# Patient Record
Sex: Male | Born: 1940 | Race: White | Hispanic: No | Marital: Single | Smoking: Former smoker
Health system: Southern US, Community
[De-identification: ages and names within clinical notes are randomized; demographics above are authoritative.]

## PROBLEM LIST (undated history)

## (undated) DIAGNOSIS — I509 Heart failure, unspecified: Secondary | ICD-10-CM

## (undated) DIAGNOSIS — E785 Hyperlipidemia, unspecified: Secondary | ICD-10-CM

## (undated) DIAGNOSIS — C801 Malignant (primary) neoplasm, unspecified: Secondary | ICD-10-CM

## (undated) DIAGNOSIS — I2510 Atherosclerotic heart disease of native coronary artery without angina pectoris: Secondary | ICD-10-CM

## (undated) DIAGNOSIS — J449 Chronic obstructive pulmonary disease, unspecified: Secondary | ICD-10-CM

## (undated) DIAGNOSIS — I10 Essential (primary) hypertension: Secondary | ICD-10-CM

## (undated) DIAGNOSIS — E119 Type 2 diabetes mellitus without complications: Secondary | ICD-10-CM

---

## 2016-12-15 ENCOUNTER — Emergency Department (HOSPITAL_COMMUNITY): Payer: Medicare Other

## 2016-12-15 ENCOUNTER — Encounter (HOSPITAL_COMMUNITY): Payer: Self-pay

## 2016-12-15 ENCOUNTER — Inpatient Hospital Stay (HOSPITAL_COMMUNITY)
Admission: EM | Admit: 2016-12-15 | Discharge: 2016-12-19 | DRG: 193 | Disposition: A | Discharge status: Home Health | Payer: Medicare Other | Attending: Internal Medicine | Admitting: Internal Medicine

## 2016-12-15 DIAGNOSIS — I25118 Atherosclerotic heart disease of native coronary artery with other forms of angina pectoris: Secondary | ICD-10-CM

## 2016-12-15 DIAGNOSIS — J181 Lobar pneumonia, unspecified organism: Principal | ICD-10-CM | POA: Diagnosis present

## 2016-12-15 DIAGNOSIS — J9621 Acute and chronic respiratory failure with hypoxia: Secondary | ICD-10-CM | POA: Diagnosis present

## 2016-12-15 DIAGNOSIS — I4891 Unspecified atrial fibrillation: Secondary | ICD-10-CM | POA: Diagnosis not present

## 2016-12-15 DIAGNOSIS — E785 Hyperlipidemia, unspecified: Secondary | ICD-10-CM | POA: Diagnosis present

## 2016-12-15 DIAGNOSIS — L97909 Non-pressure chronic ulcer of unspecified part of unspecified lower leg with unspecified severity: Secondary | ICD-10-CM

## 2016-12-15 DIAGNOSIS — Z794 Long term (current) use of insulin: Secondary | ICD-10-CM

## 2016-12-15 DIAGNOSIS — I5022 Chronic systolic (congestive) heart failure: Secondary | ICD-10-CM | POA: Diagnosis present

## 2016-12-15 DIAGNOSIS — I2510 Atherosclerotic heart disease of native coronary artery without angina pectoris: Secondary | ICD-10-CM | POA: Diagnosis present

## 2016-12-15 DIAGNOSIS — R791 Abnormal coagulation profile: Secondary | ICD-10-CM | POA: Diagnosis present

## 2016-12-15 DIAGNOSIS — N183 Chronic kidney disease, stage 3 (moderate): Secondary | ICD-10-CM | POA: Diagnosis present

## 2016-12-15 DIAGNOSIS — I493 Ventricular premature depolarization: Secondary | ICD-10-CM | POA: Diagnosis present

## 2016-12-15 DIAGNOSIS — K7031 Alcoholic cirrhosis of liver with ascites: Secondary | ICD-10-CM

## 2016-12-15 DIAGNOSIS — E1121 Type 2 diabetes mellitus with diabetic nephropathy: Secondary | ICD-10-CM | POA: Diagnosis present

## 2016-12-15 DIAGNOSIS — I130 Hypertensive heart and chronic kidney disease with heart failure and stage 1 through stage 4 chronic kidney disease, or unspecified chronic kidney disease: Secondary | ICD-10-CM | POA: Diagnosis present

## 2016-12-15 DIAGNOSIS — L97829 Non-pressure chronic ulcer of other part of left lower leg with unspecified severity: Secondary | ICD-10-CM

## 2016-12-15 DIAGNOSIS — Z23 Encounter for immunization: Secondary | ICD-10-CM

## 2016-12-15 DIAGNOSIS — E119 Type 2 diabetes mellitus without complications: Secondary | ICD-10-CM

## 2016-12-15 DIAGNOSIS — I2699 Other pulmonary embolism without acute cor pulmonale: Secondary | ICD-10-CM

## 2016-12-15 DIAGNOSIS — J189 Pneumonia, unspecified organism: Secondary | ICD-10-CM

## 2016-12-15 DIAGNOSIS — D696 Thrombocytopenia, unspecified: Secondary | ICD-10-CM | POA: Diagnosis present

## 2016-12-15 DIAGNOSIS — I10 Essential (primary) hypertension: Secondary | ICD-10-CM

## 2016-12-15 DIAGNOSIS — I509 Heart failure, unspecified: Secondary | ICD-10-CM

## 2016-12-15 DIAGNOSIS — N289 Disorder of kidney and ureter, unspecified: Secondary | ICD-10-CM

## 2016-12-15 DIAGNOSIS — J449 Chronic obstructive pulmonary disease, unspecified: Secondary | ICD-10-CM | POA: Diagnosis present

## 2016-12-15 DIAGNOSIS — E11622 Type 2 diabetes mellitus with other skin ulcer: Secondary | ICD-10-CM | POA: Diagnosis present

## 2016-12-15 DIAGNOSIS — K7460 Unspecified cirrhosis of liver: Secondary | ICD-10-CM | POA: Diagnosis present

## 2016-12-15 DIAGNOSIS — L97929 Non-pressure chronic ulcer of unspecified part of left lower leg with unspecified severity: Secondary | ICD-10-CM | POA: Diagnosis present

## 2016-12-15 DIAGNOSIS — I2609 Other pulmonary embolism with acute cor pulmonale: Secondary | ICD-10-CM | POA: Diagnosis not present

## 2016-12-15 DIAGNOSIS — Z7901 Long term (current) use of anticoagulants: Secondary | ICD-10-CM

## 2016-12-15 DIAGNOSIS — R188 Other ascites: Secondary | ICD-10-CM | POA: Diagnosis present

## 2016-12-15 DIAGNOSIS — E1122 Type 2 diabetes mellitus with diabetic chronic kidney disease: Secondary | ICD-10-CM | POA: Diagnosis present

## 2016-12-15 DIAGNOSIS — Z9981 Dependence on supplemental oxygen: Secondary | ICD-10-CM

## 2016-12-15 DIAGNOSIS — J440 Chronic obstructive pulmonary disease with acute lower respiratory infection: Secondary | ICD-10-CM | POA: Diagnosis present

## 2016-12-15 DIAGNOSIS — I83009 Varicose veins of unspecified lower extremity with ulcer of unspecified site: Secondary | ICD-10-CM | POA: Diagnosis present

## 2016-12-15 DIAGNOSIS — M542 Cervicalgia: Secondary | ICD-10-CM | POA: Diagnosis present

## 2016-12-15 DIAGNOSIS — I872 Venous insufficiency (chronic) (peripheral): Secondary | ICD-10-CM | POA: Diagnosis present

## 2016-12-15 HISTORY — DX: Type 2 diabetes mellitus without complications: E11.9

## 2016-12-15 HISTORY — DX: Atherosclerotic heart disease of native coronary artery without angina pectoris: I25.10

## 2016-12-15 HISTORY — DX: Chronic obstructive pulmonary disease, unspecified: J44.9

## 2016-12-15 HISTORY — DX: Essential (primary) hypertension: I10

## 2016-12-15 HISTORY — DX: Malignant (primary) neoplasm, unspecified: C80.1

## 2016-12-15 HISTORY — DX: Hyperlipidemia, unspecified: E78.5

## 2016-12-15 HISTORY — DX: Heart failure, unspecified: I50.9

## 2016-12-15 LAB — BASIC METABOLIC PANEL
Anion gap: 11 (ref 5–15)
BUN: 21 mg/dL — ABNORMAL HIGH (ref 6–20)
CHLORIDE: 101 mmol/L (ref 101–111)
CO2: 25 mmol/L (ref 22–32)
Calcium: 9.4 mg/dL (ref 8.9–10.3)
Creatinine, Ser: 1.27 mg/dL — ABNORMAL HIGH (ref 0.61–1.24)
GFR calc non Af Amer: 54 mL/min — ABNORMAL LOW (ref >60)
Glucose, Bld: 136 mg/dL — ABNORMAL HIGH (ref 65–99)
POTASSIUM: 4.1 mmol/L (ref 3.5–5.1)
SODIUM: 137 mmol/L (ref 135–145)

## 2016-12-15 LAB — I-STAT TROPONIN, ED: Troponin i, poc: 0.01 ng/mL (ref 0.00–0.08)

## 2016-12-15 LAB — CBC
HEMATOCRIT: 46.8 % (ref 39.0–52.0)
HEMOGLOBIN: 15.2 g/dL (ref 13.0–17.0)
MCH: 27.9 pg (ref 26.0–34.0)
MCHC: 32.5 g/dL (ref 30.0–36.0)
MCV: 85.9 fL (ref 78.0–100.0)
Platelets: 139 10*3/uL — ABNORMAL LOW (ref 150–400)
RBC: 5.45 MIL/uL (ref 4.22–5.81)
RDW: 15.1 % (ref 11.5–15.5)
WBC: 10 10*3/uL (ref 4.0–10.5)

## 2016-12-15 LAB — PROTIME-INR
INR: 4.54
Prothrombin Time: 44.6 seconds — ABNORMAL HIGH (ref 11.4–15.2)

## 2016-12-15 LAB — BRAIN NATRIURETIC PEPTIDE: B NATRIURETIC PEPTIDE 5: 453.6 pg/mL — AB (ref 0.0–100.0)

## 2016-12-15 MED ORDER — DEXTROSE 5 % IV SOLN
1.0000 g | Freq: Every day | INTRAVENOUS | Status: DC
  Administered 2016-12-16 – 2016-12-17 (×3): 1 g via INTRAVENOUS
  Filled 2016-12-15 (×4): qty 10

## 2016-12-15 MED ORDER — SODIUM CHLORIDE 0.9% FLUSH: 3.0000 mL | INTRAVENOUS | Status: DC | PRN

## 2016-12-15 MED ORDER — ISOSORBIDE MONONITRATE 20 MG PO TABS
20.0000 mg | ORAL_TABLET | Freq: Two times a day (BID) | ORAL | Status: DC
  Administered 2016-12-16 – 2016-12-19 (×7): 20 mg via ORAL
  Filled 2016-12-15 (×8): qty 1

## 2016-12-15 MED ORDER — FUROSEMIDE 20 MG PO TABS
160.0000 mg | ORAL_TABLET | Freq: Every day | ORAL | Status: DC
  Administered 2016-12-16 – 2016-12-19 (×4): 160 mg via ORAL
  Filled 2016-12-15 (×4): qty 8

## 2016-12-15 MED ORDER — SODIUM CHLORIDE 0.9 % IV BOLUS (SEPSIS)
1000.0000 mL | Freq: Once | INTRAVENOUS | Status: AC
  Administered 2016-12-15: 1000 mL via INTRAVENOUS

## 2016-12-15 MED ORDER — SODIUM CHLORIDE 0.9 % IV SOLN: 250.0000 mL | INTRAVENOUS | Status: DC | PRN

## 2016-12-15 MED ORDER — INSULIN ASPART 100 UNIT/ML ~~LOC~~ SOLN
0.0000 [IU] | Freq: Three times a day (TID) | SUBCUTANEOUS | Status: DC
Start: 2016-12-16 — End: 2016-12-19
  Administered 2016-12-16: 1 [IU] via SUBCUTANEOUS
  Administered 2016-12-16: 3 [IU] via SUBCUTANEOUS
  Administered 2016-12-17 (×2): 2 [IU] via SUBCUTANEOUS
  Administered 2016-12-18: 1 [IU] via SUBCUTANEOUS
  Administered 2016-12-18 – 2016-12-19 (×2): 2 [IU] via SUBCUTANEOUS

## 2016-12-15 MED ORDER — INSULIN GLARGINE 100 UNIT/ML ~~LOC~~ SOLN
18.0000 [IU] | Freq: Every day | SUBCUTANEOUS | Status: DC
  Administered 2016-12-16 (×2): 18 [IU] via SUBCUTANEOUS
  Filled 2016-12-15 (×2): qty 0.18

## 2016-12-15 MED ORDER — IPRATROPIUM-ALBUTEROL 0.5-2.5 (3) MG/3ML IN SOLN
3.0000 mL | RESPIRATORY_TRACT | Status: DC | PRN
  Administered 2016-12-16: 3 mL via RESPIRATORY_TRACT
  Filled 2016-12-15: qty 3

## 2016-12-15 MED ORDER — HYDROCODONE-ACETAMINOPHEN 5-325 MG PO TABS
1.0000 | ORAL_TABLET | ORAL | Status: DC | PRN
  Administered 2016-12-16: 1 via ORAL
  Administered 2016-12-17: 2 via ORAL
  Administered 2016-12-17: 1 via ORAL
  Administered 2016-12-18 (×2): 2 via ORAL
  Administered 2016-12-19: 1 via ORAL
  Filled 2016-12-15 (×2): qty 1
  Filled 2016-12-15: qty 2
  Filled 2016-12-15 (×4): qty 1
  Filled 2016-12-15: qty 2

## 2016-12-15 MED ORDER — MORPHINE SULFATE (PF) 4 MG/ML IV SOLN
4.0000 mg | Freq: Once | INTRAVENOUS | Status: AC
  Administered 2016-12-15: 4 mg via INTRAVENOUS
  Filled 2016-12-15: qty 1

## 2016-12-15 MED ORDER — INSULIN ASPART 100 UNIT/ML ~~LOC~~ SOLN
0.0000 [IU] | Freq: Every day | SUBCUTANEOUS | Status: DC
  Administered 2016-12-17: 2 [IU] via SUBCUTANEOUS

## 2016-12-15 MED ORDER — ACETAMINOPHEN 325 MG PO TABS: 650.0000 mg | ORAL_TABLET | Freq: Four times a day (QID) | ORAL | Status: DC | PRN

## 2016-12-15 MED ORDER — SODIUM CHLORIDE 0.9% FLUSH
3.0000 mL | Freq: Two times a day (BID) | INTRAVENOUS | Status: DC
  Administered 2016-12-16 – 2016-12-19 (×5): 3 mL via INTRAVENOUS

## 2016-12-15 MED ORDER — SIMVASTATIN 40 MG PO TABS
40.0000 mg | ORAL_TABLET | Freq: Every day | ORAL | Status: DC
  Administered 2016-12-16 – 2016-12-18 (×3): 40 mg via ORAL
  Filled 2016-12-15 (×3): qty 1

## 2016-12-15 MED ORDER — AZITHROMYCIN 250 MG PO TABS
500.0000 mg | ORAL_TABLET | Freq: Every day | ORAL | Status: DC
  Administered 2016-12-16: 500 mg via ORAL
  Filled 2016-12-15: qty 2

## 2016-12-15 MED ORDER — IOPAMIDOL (ISOVUE-370) INJECTION 76%
INTRAVENOUS | Status: AC
  Administered 2016-12-15: 80 mL
  Filled 2016-12-15: qty 100

## 2016-12-15 NOTE — ED Notes (Signed)
Patient transported to CT 

## 2016-12-15 NOTE — ED Triage Notes (Signed)
Pt reports shortness of breath X3 days. Pt reports mildly productive cough with white sputum. Pt reports home O2 usage at home.

## 2016-12-15 NOTE — ED Provider Notes (Signed)
Emergency Department Provider Note   I have reviewed the triage vital signs and the nursing notes.   HISTORY  Chief Complaint Shortness of Breath   HPI Edward Meyer is a 75 y.o. male with PMH significant for HTN, CAD,DM,CHF,COPD came to ED with C/O worsening shortness of breath for 3 days. Patient flew from [place] to [place] on December 16, then drove to [place] today from [place]. Patient states that on December 16 he developed nausea vomiting and diarrhea which lasted for one day. Later he developed shortness of breath, cough with clear mucus and mild nasal congestion. He do endorse a few chills but denies any fever. He also complained of pain in the back of his neck traveling to his shoulders and down in the spine. He described it as intermittent sharp shooting pain. He also complain of pain in his left leg. Denies any swelling.  Patient uses 2 L of oxygen at night.  He is on Coumadin.   Past Medical History:  Diagnosis Date  . Cancer ([place])   . CHF (congestive heart failure) ([place])   . COPD (chronic obstructive pulmonary disease) ([place])   . Coronary artery disease   . Diabetes mellitus without complication ([place])   . Hyperlipidemia   . Hypertension     There are no active problems to display for this patient.   History reviewed. No pertinent surgical history.    Allergies Patient has no known allergies.  History reviewed. No pertinent family history.  Social History Social History  Substance Use Topics  . Smoking status: Never Smoker  . Smokeless tobacco: Never Used  . Alcohol use No    Review of Systems Constitutional: No fever, chills Eyes: No visual changes. ENT: No sore throat. Cardiovascular: Denies chest pain. Respiratory:  shortness of breath. Gastrointestinal: No abdominal pain.  No nausea, no vomiting.  No diarrhea.  No constipation. Genitourinary: Negative for dysuria. Musculoskeletal:  back pain. Left leg pain. Skin: Negative  for rash. Neurological: Negative for headaches, focal weakness or numbness. 10-point ROS otherwise negative.  ____________________________________________   PHYSICAL EXAM:  VITAL SIGNS: ED Triage Vitals  Enc Vitals Group     BP 12/15/16 1812 118/59     Pulse Rate 12/15/16 1812 87     Resp 12/15/16 1812 18     Temp 12/15/16 1812 98.3 F (36.8 C)     Temp Source 12/15/16 1812 Oral     SpO2 12/15/16 1812 95 %     Weight --      Height --      Head Circumference --      Peak Flow --      Pain Score 12/15/16 1813 6     Pain Loc --      Pain Edu? --      Excl. in [place]? --    Constitutional: Alert and oriented. Mildly tachypneic, in no acute distress. Eyes: Conjunctivae are normal. PERRL. EOMI. Head: Atraumatic. Mouth/Throat: Mucous membranes are moist.  Oropharynx non-erythematous. Neck: No stridor.  No meningeal signs. No cervical spine tenderness to palpation. Cardiovascular: Normal rate, regular rhythm. Good peripheral circulation. Grossly normal heart sounds.   Respiratory: There are mildly decreased air entry at right mid and lower lung field. No rhonchi or wheezing. Gastrointestinal: Soft and nontender. No distention.  Musculoskeletal: Left calf tenderness without any edema , there were 3 venous stasis ulcers on lateral side of left leg approximately 1 x1 cm, 1.52 cm and 2 3 cm with marked erythema and  serosanguineous discharge. Neurologic:  Normal speech and language. No gross focal neurologic deficits are appreciated.  Skin:  Skin is warm, dry and intact. No rash noted. Psychiatric: Mood and affect are normal. Speech and behavior are normal.  ____________________________________________   LABS (all labs ordered are listed, but only abnormal results are displayed)  Labs Reviewed  BASIC METABOLIC PANEL - Abnormal; Notable for the following:       Result Value   Glucose, Bld 136 (*)    BUN 21 (*)    Creatinine, Ser 1.27 (*)    GFR calc non Af Amer 54 (*)    All  other components within normal limits  CBC - Abnormal; Notable for the following:    Platelets 139 (*)    All other components within normal limits  BRAIN NATRIURETIC PEPTIDE - Abnormal; Notable for the following:    B Natriuretic Peptide 453.6 (*)    All other components within normal limits  PROTIME-INR - Abnormal; Notable for the following:    Prothrombin Time 44.6 (*)    INR 4.54 (*)    All other components within normal limits  I-STAT TROPOININ, ED   ____________________________________________  EKG   EKG Interpretation  Date/Time:  Thursday December 15 2016 18:19:51 EST Ventricular Rate:  84 PR Interval:  158 QRS Duration: 72 QT Interval:  454 QTC Calculation: 536 R Axis:   68 Text Interpretation:  Sinus rhythm with frequent Premature ventricular complexes Low voltage QRS Cannot rule out Anterior infarct , age undetermined Abnormal ECG Confirmed by DELO  MD, DOUGLAS ([id number]) on 12/15/2016 8:16:29 PM      ____________________________________________  RADIOLOGY  Dg Chest 2 View  Result Date: 12/15/2016 CLINICAL DATA:  Productive cough with shortness of breath for 3 days. Pain in the neck, both shoulders and upper back. History of asthma, pacemaker and diabetes. EXAM: CHEST  2 VIEW COMPARISON:  None. FINDINGS: Left subclavian AICD leads are present within the right atrium and right ventricle. The heart size is normal status post median sternotomy. There is aortic atherosclerosis. The lungs appear clear. There is no pleural effusion or pneumothorax. EKG snap overlies the upper right chest. Mild thoracic spine degenerative changes are noted. IMPRESSION: No acute cardiopulmonary process or explanation for the patient's symptoms identified. Electronically Signed   By: Richardean Sale M.D.   On: 12/15/2016 18:45   Ct Angio Chest Pe W Or Wo Contrast  Result Date: 12/15/2016 CLINICAL DATA:  75 year old male with shortness of breath for several years. History of COPD, coronary  artery disease, and CHF. EXAM: CT ANGIOGRAPHY CHEST WITH CONTRAST TECHNIQUE: Multidetector CT imaging of the chest was performed using the standard protocol during bolus administration of intravenous contrast. Multiplanar CT image reconstructions and MIPs were obtained to evaluate the vascular anatomy. CONTRAST:  80 cc Isovue 370 COMPARISON:  Chest radiograph dated 12/15/2016 FINDINGS: Cardiovascular: There is moderate cardiomegaly with dilatation of the right atrium. There is retrograde flow of contrast from the right atrium into the IVC compatible with a degree of right cardiac dysfunction. Correlation with echocardiogram recommended. There is advanced coronary vascular calcification involving the left main, LAD, left circumflex artery, and to lesser degree RCA. There is no pericardial effusion. Left pectoral dual lead AICD device with leads in the right atrium and right ventricle. There is moderate atherosclerotic calcification of the thoracic aorta. There is no aneurysmal dilatation. Evaluation of the pulmonary arteries is limited due to suboptimal opacification of the peripheral branches. There is however decreased opacification of the  segmental branch point and subsegmental branches of the right lower lobe pulmonary artery (series 5 image 164- 195) most suspicious for pulmonary artery embolus. Mediastinum/Nodes: There is no hilar or mediastinal adenopathy. The esophagus is grossly unremarkable. No mediastinal fluid collection. No thyroid nodules identified. Lungs/Pleura: Mild centrilobular emphysema. There is diffuse chronic interstitial coarsening and hazy ground-glass density throughout the lungs. No interstitial edema. There is a focal area of consolidation in the left infrahilar region within the lingula concerning for pneumonia. A focal pulmonary infarct is less likely. There is a small right pleural effusion with associated mild compressive atelectasis of the right lower lobe. Pneumonia is less likely.  There is no pneumothorax. The central airways are patent. Upper Abdomen: There is irregularity of the hepatic contour compatible with cirrhosis. Small perihepatic free fluid noted. The visualized upper abdomen is otherwise unremarkable. Musculoskeletal: Degenerative changes of the spine with multilevel osteophyte. No acute fracture. Median sternotomy wires noted and appear intact. Review of the MIP images confirms the above findings. IMPRESSION: Non opacification of the segmental branch point and subsegmental branches of the right lower lobe. Although there is suboptimal opacification of the distal pulmonary artery branches, findings are suspicious for PE. Focal left infrahilar/lingular consolidative change concerning for pneumonia. The pulmonary infarct is less likely. Clinical correlation is recommended. Moderate cardiomegaly with multivessel coronary vascular disease and evidence of right cardiac dysfunction. Correlation with echocardiogram recommended. Small right pleural effusion. Cirrhosis with partially visualized small perihepatic ascites. These results were called by telephone at the time of interpretation on 12/15/2016 at 10:13 pm to Dr. Veryl Speak , who verbally acknowledged these results. Electronically Signed   By: Anner Crete M.D.   On: 12/15/2016 22:21   Ct Cervical Spine Wo Contrast  Result Date: 12/15/2016 CLINICAL DATA:  75 year old male with neck pain. EXAM: CT CERVICAL SPINE WITHOUT CONTRAST TECHNIQUE: Multidetector CT imaging of the cervical spine was performed without intravenous contrast. Multiplanar CT image reconstructions were also generated. COMPARISON:  None. FINDINGS: Alignment: Normal. Skull base and vertebrae: No acute fracture. No primary bone lesion or focal pathologic process. Soft tissues and spinal canal: No prevertebral fluid or swelling. No visible canal hematoma. Disc levels: There is degenerative changes. Disc osteophyte complex with moderate narrowing of the left  neural foramina at C5-C6. The remainder of the neural foramina as well as the central canal appear widely patent. Upper chest: Negative. Cardiac pacemaker wires partially visualized in the left chest wall. Other: None IMPRESSION: No acute/ traumatic cervical spine pathology. Degenerative changes with disc osteophyte complex and moderate narrowing of the left neural foramina at C5-C6. Electronically Signed   By: Anner Crete M.D.   On: 12/15/2016 21:58    ____________________________________________   PROCEDURES  Procedure(s) performed:   Procedures   ____________________________________________   INITIAL IMPRESSION / ASSESSMENT AND PLAN / ED COURSE  Pertinent labs & imaging results that were available during my care of the patient were reviewed by me and considered in my medical decision making (see chart for details).  Edward Meyer is a 75 y.o. male with PMH significant for HTN, CAD,DM,CHF,COPD came to ED with C/O worsening shortness of breath for 3 days. Patient flew from [place] to [place] on December 16, then drove to [place] today from [place]. Was also complaining of he is sharp shooting neck pain, had more like cervical radiculopathy. No tingling or numbness or focal weakness.  He was saturating in mid 90s on room air. With mildly decreased air entry on right mid and lower  lung field.  He is also having venous stasis 3 ulcers on his left leg, 1 with active  serosanguineous discharge and erythema.  He is on Coumadin with supratherapeutic INR of 4.54.  On CTA he is having small left-sided some segmental PE and pleural effusion, there was also concern for left infrahilar/lingual consolidation suspected for pneumonia.  He also has an history of lymphoma, in remission since 2000.  [facility] hospitalist was called for admission. ____________________________________________  FINAL CLINICAL IMPRESSION(S) / ED DIAGNOSES  Pulmonary embolism.   MEDICATIONS GIVEN  DURING THIS VISIT:  Medications  sodium chloride 0.9 % bolus 1,000 mL (1,000 mLs Intravenous New Bag/Given 12/15/16 2052)  morphine 4 MG/ML injection 4 mg (4 mg Intravenous Given 12/15/16 2052)  iopamidol (ISOVUE-370) 76 % injection (80 mLs  Contrast Given 12/15/16 2118)     NEW OUTPATIENT MEDICATIONS STARTED DURING THIS VISIT:  New Prescriptions   No medications on file      Note:  This document was prepared using Dragon voice recognition software and may include unintentional dictation errors.   Emergency Medicine   Lorella Nimrod, MD 12/15/16 [place], MD 12/15/16 [phone]

## 2016-12-15 NOTE — H&P (Signed)
History and Physical    Edward Meyer [id number] DOB: 1941-02-04 DOA: 12/15/2016  PCP: Pcp Not In System   Patient coming from: Home  Chief Complaint: Dyspnea, productive cough  HPI: Edward Meyer is a 75 y.o. male with medical history significant for coronary artery disease, chronic CHF, COPD, insulin-dependent diabetes mellitus, hypertension, and cirrhosis who presents to the emergency department with 3 days of dyspnea, productive cough, and upper back pain. Patient was traveling from [place] 3 days prior to admission and developed acute GI upset with nausea, vomiting, and diarrhea while on the layover in [place]. Patient reports that the GI symptoms had resolved by the following day, but he had developed increase in his chronic dyspnea and a new productive cough. He denies any subjective fevers or chills associated with this and denies any chest pain or palpitations. He describes his upper back pain as sharp, severe, and worse with deep inspiration or cough. He reports taking warfarin for a "weak heart." He endorses chronic leg swelling and left leg pain associated with ulcers, but has not noted an increase in this. He denies personal or family history of VTE. Patient reports using 2 L/m supplemental oxygen at night, but left his concentrator back home in [place]. He denies any melena, hematochezia, or easy bruising or bleeding.    ED Course: Upon arrival to the ED, patient is found to be afebrile, saturating adequately on room air, and with vital signs stable. EKG features a sinus rhythm with PVCs and chest x-ray is negative for acute cardiopulmonary disease. Chemistry panel is notable for serum creatinine of 1.27 with no prior available for comparison. CBC features a mild thrombocytopenia with platelets 139,000. BNP is elevated to 454, troponin is within the normal limits, and INR is supratherapeutic at 4.54. CT of the cervical spine was obtained and demonstrates chronic degenerative  changes, but no acute abnormality. CTA PE study was obtained and findings are consistent with a likely PE in the right lower lobe segmental and subsegmental branches, as well as focal infrahilar and lingular consolidation concerning for pneumonia. Patient was treated with 1 L of normal saline and 4 mg IV morphine in the emergency department. He remained hemodynamically stable in the ED and has been in mild respiratory distress. He will be observed on the telemetry unit for ongoing evaluation and management of dyspnea and pleuritic pain with suspected PE despite supratherapeutic warfarin, as well as possible community-acquired pneumonia.  Review of Systems:  All other systems reviewed and apart from HPI, are negative.  Past Medical History:  Diagnosis Date  . Cancer ([place])   . CHF (congestive heart failure) ([place])   . COPD (chronic obstructive pulmonary disease) ([place])   . Coronary artery disease   . Diabetes mellitus without complication ([place])   . Hyperlipidemia   . Hypertension     History reviewed. No pertinent surgical history.   reports that he has never smoked. He has never used smokeless tobacco. He reports that he does not drink alcohol. His drug history is not on file.  No Known Allergies  History reviewed. No pertinent family history.   Prior to Admission medications   Medication Sig Start Date End Date Taking? Authorizing Provider  Ca Carbonate-Mag Hydroxide (ROLAIDS) 550-110 MG CHEW Chew 1 each by mouth daily as needed (heartburn).   Yes Historical Provider, MD  carvedilol (COREG) 6.25 MG tablet Take 6.25 mg by mouth 2 (two) times daily with a meal.   Yes Historical Provider, MD  furosemide (LASIX)  40 MG tablet Take 160 mg by mouth daily.   Yes Historical Provider, MD  insulin glargine (LANTUS) 100 UNIT/ML injection Inject 22 Units into the skin at bedtime.   Yes Historical Provider, MD  isosorbide mononitrate (ISMO,MONOKET) 20 MG tablet Take 20 mg by mouth 2 (two) times daily  at 10 AM and 5 PM.   Yes Historical Provider, MD  lisinopril (PRINIVIL,ZESTRIL) 5 MG tablet Take 5 mg by mouth daily.   Yes Historical Provider, MD  OXYGEN Inhale 2 L into the lungs at bedtime as needed (for SOB).    Yes Historical Provider, MD  potassium chloride SA (K-DUR,KLOR-CON) 20 MEQ tablet Take 20 mEq by mouth daily.   Yes Historical Provider, MD  simvastatin (ZOCOR) 40 MG tablet Take 40 mg by mouth daily at 6 PM.   Yes Historical Provider, MD  sotalol (BETAPACE) 80 MG tablet Take 80 mg by mouth 2 (two) times daily.   Yes Historical Provider, MD  warfarin (COUMADIN) 5 MG tablet Take 5-7.5 mg by mouth See admin instructions. Takes 5mg  on odd days  Takes 7.5mg  on even days   Yes Historical Provider, MD    Physical Exam: Vitals:   12/15/16 1921 12/15/16 1936 12/15/16 2000 12/15/16 2030  BP: (!) 108/52 119/62 110/58 128/68  Pulse: 82 83 81 (!) 28  Resp: 15 24 (!) 27 24  Temp:      TempSrc:      SpO2: 95% 96% 93% 91%      Constitutional: Appears chronically-ill, uncomfortable, no acute distress Eyes: PERTLA, lids and conjunctivae normal ENMT: Mucous membranes are moist. Posterior pharynx clear of any exudate or lesions.   Neck: normal, supple, no masses, no thyromegaly Respiratory: Slightly diminished bilaterally without appreciable wheeze, rhonchi, or crackles. No accessory muscle use.  Cardiovascular: S1 & S2 heard, regular rate and rhythm, grade 3 systolic murmur at lower sternal borders. 2+ edema to bilateral LEs. No significant JVD. Abdomen: Mild distension, mild tenderness throughout, no masses palpated. Bowel sounds normal.  Musculoskeletal: no clubbing / cyanosis. No joint deformity upper and lower extremities. Normal muscle tone.  Skin: Ulcers overly anterior lower left leg with crust, surrounding erythema, and slight warmth; no drainage or odor. Warm, dry, well-perfused. Neurologic: CN 2-12 grossly intact. Sensation intact, DTR normal. Strength 5/5 in all 4 limbs.    Psychiatric: Normal judgment and insight. Alert and oriented x 3. Normal mood and affect.     Labs on Admission: I have personally reviewed following labs and imaging studies  CBC:  Recent Labs Lab 12/15/16 1816  WBC 10.0  HGB 15.2  HCT 46.8  MCV 85.9  PLT [id number]*   Basic Metabolic Panel:  Recent Labs Lab 12/15/16 1816  NA 137  K 4.1  CL 101  CO2 25  GLUCOSE 136*  BUN 21*  CREATININE 1.27*  CALCIUM 9.4   GFR: CrCl cannot be calculated (Unknown ideal weight.). Liver Function Tests: No results for input(s): AST, ALT, ALKPHOS, BILITOT, PROT, ALBUMIN in the last 168 hours. No results for input(s): LIPASE, AMYLASE in the last 168 hours. No results for input(s): AMMONIA in the last 168 hours. Coagulation Profile:  Recent Labs Lab 12/15/16 2031  INR 4.54*   Cardiac Enzymes: No results for input(s): CKTOTAL, CKMB, CKMBINDEX, TROPONINI in the last 168 hours. BNP (last 3 results) No results for input(s): PROBNP in the last 8760 hours. HbA1C: No results for input(s): HGBA1C in the last 72 hours. CBG: No results for input(s): GLUCAP in the last 168  hours. Lipid Profile: No results for input(s): CHOL, HDL, LDLCALC, TRIG, CHOLHDL, LDLDIRECT in the last 72 hours. Thyroid Function Tests: No results for input(s): TSH, T4TOTAL, FREET4, T3FREE, THYROIDAB in the last 72 hours. Anemia Panel: No results for input(s): VITAMINB12, FOLATE, FERRITIN, TIBC, IRON, RETICCTPCT in the last 72 hours. Urine analysis: No results found for: COLORURINE, APPEARANCEUR, LABSPEC, PHURINE, GLUCOSEU, HGBUR, BILIRUBINUR, KETONESUR, PROTEINUR, UROBILINOGEN, NITRITE, LEUKOCYTESUR Sepsis Labs: @LABRCNTIP (procalcitonin:4,lacticidven:4) )No results found for this or any previous visit (from the past 240 hour(s)).   Radiological Exams on Admission: Dg Chest 2 View  Result Date: 12/15/2016 CLINICAL DATA:  Productive cough with shortness of breath for 3 days. Pain in the neck, both shoulders and  upper back. History of asthma, pacemaker and diabetes. EXAM: CHEST  2 VIEW COMPARISON:  None. FINDINGS: Left subclavian AICD leads are present within the right atrium and right ventricle. The heart size is normal status post median sternotomy. There is aortic atherosclerosis. The lungs appear clear. There is no pleural effusion or pneumothorax. EKG snap overlies the upper right chest. Mild thoracic spine degenerative changes are noted. IMPRESSION: No acute cardiopulmonary process or explanation for the patient's symptoms identified. Electronically Signed   By: Richardean Sale M.D.   On: 12/15/2016 18:45   Ct Angio Chest Pe W Or Wo Contrast  Result Date: 12/15/2016 CLINICAL DATA:  75 year old male with shortness of breath for several years. History of COPD, coronary artery disease, and CHF. EXAM: CT ANGIOGRAPHY CHEST WITH CONTRAST TECHNIQUE: Multidetector CT imaging of the chest was performed using the standard protocol during bolus administration of intravenous contrast. Multiplanar CT image reconstructions and MIPs were obtained to evaluate the vascular anatomy. CONTRAST:  80 cc Isovue 370 COMPARISON:  Chest radiograph dated 12/15/2016 FINDINGS: Cardiovascular: There is moderate cardiomegaly with dilatation of the right atrium. There is retrograde flow of contrast from the right atrium into the IVC compatible with a degree of right cardiac dysfunction. Correlation with echocardiogram recommended. There is advanced coronary vascular calcification involving the left main, LAD, left circumflex artery, and to lesser degree RCA. There is no pericardial effusion. Left pectoral dual lead AICD device with leads in the right atrium and right ventricle. There is moderate atherosclerotic calcification of the thoracic aorta. There is no aneurysmal dilatation. Evaluation of the pulmonary arteries is limited due to suboptimal opacification of the peripheral branches. There is however decreased opacification of the segmental  branch point and subsegmental branches of the right lower lobe pulmonary artery (series 5 image 164- 195) most suspicious for pulmonary artery embolus. Mediastinum/Nodes: There is no hilar or mediastinal adenopathy. The esophagus is grossly unremarkable. No mediastinal fluid collection. No thyroid nodules identified. Lungs/Pleura: Mild centrilobular emphysema. There is diffuse chronic interstitial coarsening and hazy ground-glass density throughout the lungs. No interstitial edema. There is a focal area of consolidation in the left infrahilar region within the lingula concerning for pneumonia. A focal pulmonary infarct is less likely. There is a small right pleural effusion with associated mild compressive atelectasis of the right lower lobe. Pneumonia is less likely. There is no pneumothorax. The central airways are patent. Upper Abdomen: There is irregularity of the hepatic contour compatible with cirrhosis. Small perihepatic free fluid noted. The visualized upper abdomen is otherwise unremarkable. Musculoskeletal: Degenerative changes of the spine with multilevel osteophyte. No acute fracture. Median sternotomy wires noted and appear intact. Review of the MIP images confirms the above findings. IMPRESSION: Non opacification of the segmental branch point and subsegmental branches of the right lower lobe.  Although there is suboptimal opacification of the distal pulmonary artery branches, findings are suspicious for PE. Focal left infrahilar/lingular consolidative change concerning for pneumonia. The pulmonary infarct is less likely. Clinical correlation is recommended. Moderate cardiomegaly with multivessel coronary vascular disease and evidence of right cardiac dysfunction. Correlation with echocardiogram recommended. Small right pleural effusion. Cirrhosis with partially visualized small perihepatic ascites. These results were called by telephone at the time of interpretation on 12/15/2016 at 10:13 pm to Dr.  Veryl Speak , who verbally acknowledged these results. Electronically Signed   By: Anner Crete M.D.   On: 12/15/2016 22:21   Ct Cervical Spine Wo Contrast  Result Date: 12/15/2016 CLINICAL DATA:  75 year old male with neck pain. EXAM: CT CERVICAL SPINE WITHOUT CONTRAST TECHNIQUE: Multidetector CT imaging of the cervical spine was performed without intravenous contrast. Multiplanar CT image reconstructions were also generated. COMPARISON:  None. FINDINGS: Alignment: Normal. Skull base and vertebrae: No acute fracture. No primary bone lesion or focal pathologic process. Soft tissues and spinal canal: No prevertebral fluid or swelling. No visible canal hematoma. Disc levels: There is degenerative changes. Disc osteophyte complex with moderate narrowing of the left neural foramina at C5-C6. The remainder of the neural foramina as well as the central canal appear widely patent. Upper chest: Negative. Cardiac pacemaker wires partially visualized in the left chest wall. Other: None IMPRESSION: No acute/ traumatic cervical spine pathology. Degenerative changes with disc osteophyte complex and moderate narrowing of the left neural foramina at C5-C6. Electronically Signed   By: Anner Crete M.D.   On: 12/15/2016 21:58    EKG: Independently reviewed. Sinus rhythm, PVCs  Assessment/Plan  1. Pulmonary embolism   - Pt presents with 3 days of SOB after travel with prolonged immobilization - CTA suggests likely PE in segmental and subsegmental branches of RLL; there is evidence for right-heart failure on the CT, but not clear if acute   - Pt is anticoagulated with warfarin and INR is 4.54 on admission - He is hemodynamically stable and not in acute respiratory distress on admission - Plan to obtain TTE for further evaluation of right-heart dysfunction, BLE dopplers for DVT (given PE despite supratherapeutic INR, IVC filter may be indicated if there is DVT)  - Monitor on telemetry with supportive care,  consider switching his anticoagulant    2. CAP - Focal infiltrate in lingula on CTA suggestive of PNA - There is no fever or leukocytosis, and respiratory sxs could be explained by the PE, but given the degree of patient's comorbidity, prudent to treat now - Check sputum culture and gram stain, check urine for strep pneumo antigens, start Rocephin and azithromycin    3. COPD  - No wheezing on exam  - Continue neb treatments    4. Cirrhosis  - Liver appears cirrhotic on CTA and there is mild ascites  - He endorses remote hx of alcohol abuse, denies hx of viral hepatitis  - He is managed at home with a non-selective beta-blocker, currently held out of concern for acute PE and right heart failure  5. Chronic CHF  - Pt appears roughly euvolemic on admission  - He was given a 1 liter NS bolus in ED  - He is managed at home with Lasix 120 mg qD, lisinopril, and Coreg - TTE is ordered as above   - Monitor SLIV, follow daily wts and I/Os, and fluid-restrict diet  - Planning to resume home-dose Lasix in am; hold lisinopril in setting of kidney disease of uncertain chronicity;  hold Coreg given PE with concern for right heart failure   6. CAD - Pt denies chest pain on admission - Troponin is negative and there are no acute ischemic features appreciated on EKG  - Continue Zocor and isosorbide; hold Coreg and lisinopril for now as above   7. Insulin-dependent DM  - No A1c on file  - Managed at home with Lantus 22 units qHS  - Check CBG with meals and qHS  - Start Lantus 18 units qHS with a low-intensity SSI correctional   8. Hypertension  - BP soft but stable in ED  - Managed at home with lisinopril, sotalol, and Coreg; these have been held as above - Monitor and treat with prn's for now    9. Kidney disease  - SCr is 1.27 on admission; pt denies hx of kidney disease  - He was given a liter of NS in ED and his lisinopril has been held  - Repeat chem panel in am   10. Venous stasis  with ulcers - There is evidence for chronic venous stasis and chronic-appearing ulcers noted on left shin  - There is some surrounding erythema and minimal warmth; no tenderness, drainage, or odor - Wound care consultation requested for recommendations     DVT prophylaxis: warfarin  Code Status: Full  Family Communication: Daughter updated at bedside at patient's request Disposition Plan: Observe on telemetry Consults called: None Admission status: Observation   Vianne Bulls, MD [facility] Pager [phone]  If 7PM-7AM, please contact night-coverage [email or web address] Password [facility]  12/15/2016, 11:24 PM

## 2016-12-16 ENCOUNTER — Encounter (HOSPITAL_COMMUNITY): Payer: Self-pay | Admitting: Family Medicine

## 2016-12-16 ENCOUNTER — Observation Stay (HOSPITAL_BASED_OUTPATIENT_CLINIC_OR_DEPARTMENT_OTHER): Payer: Medicare Other

## 2016-12-16 ENCOUNTER — Observation Stay (HOSPITAL_COMMUNITY): Payer: Medicare Other

## 2016-12-16 DIAGNOSIS — E1121 Type 2 diabetes mellitus with diabetic nephropathy: Secondary | ICD-10-CM | POA: Diagnosis present

## 2016-12-16 DIAGNOSIS — I2699 Other pulmonary embolism without acute cor pulmonale: Secondary | ICD-10-CM

## 2016-12-16 DIAGNOSIS — I872 Venous insufficiency (chronic) (peripheral): Secondary | ICD-10-CM | POA: Diagnosis present

## 2016-12-16 DIAGNOSIS — R188 Other ascites: Secondary | ICD-10-CM | POA: Diagnosis present

## 2016-12-16 DIAGNOSIS — I2510 Atherosclerotic heart disease of native coronary artery without angina pectoris: Secondary | ICD-10-CM | POA: Diagnosis present

## 2016-12-16 DIAGNOSIS — J9621 Acute and chronic respiratory failure with hypoxia: Secondary | ICD-10-CM | POA: Diagnosis present

## 2016-12-16 DIAGNOSIS — I2609 Other pulmonary embolism with acute cor pulmonale: Secondary | ICD-10-CM | POA: Diagnosis not present

## 2016-12-16 DIAGNOSIS — Z7901 Long term (current) use of anticoagulants: Secondary | ICD-10-CM | POA: Diagnosis not present

## 2016-12-16 DIAGNOSIS — Z9981 Dependence on supplemental oxygen: Secondary | ICD-10-CM | POA: Diagnosis not present

## 2016-12-16 DIAGNOSIS — E785 Hyperlipidemia, unspecified: Secondary | ICD-10-CM | POA: Diagnosis present

## 2016-12-16 DIAGNOSIS — J181 Lobar pneumonia, unspecified organism: Secondary | ICD-10-CM | POA: Diagnosis present

## 2016-12-16 DIAGNOSIS — J42 Unspecified chronic bronchitis: Secondary | ICD-10-CM

## 2016-12-16 DIAGNOSIS — I4891 Unspecified atrial fibrillation: Secondary | ICD-10-CM | POA: Diagnosis not present

## 2016-12-16 DIAGNOSIS — E11622 Type 2 diabetes mellitus with other skin ulcer: Secondary | ICD-10-CM | POA: Diagnosis present

## 2016-12-16 DIAGNOSIS — I130 Hypertensive heart and chronic kidney disease with heart failure and stage 1 through stage 4 chronic kidney disease, or unspecified chronic kidney disease: Secondary | ICD-10-CM | POA: Diagnosis present

## 2016-12-16 DIAGNOSIS — D696 Thrombocytopenia, unspecified: Secondary | ICD-10-CM | POA: Diagnosis present

## 2016-12-16 DIAGNOSIS — I493 Ventricular premature depolarization: Secondary | ICD-10-CM | POA: Diagnosis present

## 2016-12-16 DIAGNOSIS — Z23 Encounter for immunization: Secondary | ICD-10-CM | POA: Diagnosis present

## 2016-12-16 DIAGNOSIS — E1122 Type 2 diabetes mellitus with diabetic chronic kidney disease: Secondary | ICD-10-CM | POA: Diagnosis present

## 2016-12-16 DIAGNOSIS — E119 Type 2 diabetes mellitus without complications: Secondary | ICD-10-CM | POA: Diagnosis not present

## 2016-12-16 DIAGNOSIS — N183 Chronic kidney disease, stage 3 (moderate): Secondary | ICD-10-CM | POA: Diagnosis present

## 2016-12-16 DIAGNOSIS — I5022 Chronic systolic (congestive) heart failure: Secondary | ICD-10-CM | POA: Diagnosis present

## 2016-12-16 DIAGNOSIS — J440 Chronic obstructive pulmonary disease with acute lower respiratory infection: Secondary | ICD-10-CM | POA: Diagnosis present

## 2016-12-16 DIAGNOSIS — I5042 Chronic combined systolic (congestive) and diastolic (congestive) heart failure: Secondary | ICD-10-CM | POA: Diagnosis not present

## 2016-12-16 DIAGNOSIS — K7460 Unspecified cirrhosis of liver: Secondary | ICD-10-CM | POA: Diagnosis present

## 2016-12-16 DIAGNOSIS — L97929 Non-pressure chronic ulcer of unspecified part of left lower leg with unspecified severity: Secondary | ICD-10-CM | POA: Diagnosis present

## 2016-12-16 DIAGNOSIS — R791 Abnormal coagulation profile: Secondary | ICD-10-CM | POA: Diagnosis present

## 2016-12-16 DIAGNOSIS — Z794 Long term (current) use of insulin: Secondary | ICD-10-CM | POA: Diagnosis not present

## 2016-12-16 LAB — HIV ANTIBODY (ROUTINE TESTING W REFLEX): HIV Screen 4th Generation wRfx: NONREACTIVE

## 2016-12-16 LAB — GLUCOSE, CAPILLARY
GLUCOSE-CAPILLARY: 122 mg/dL — AB (ref 65–99)
GLUCOSE-CAPILLARY: 109 mg/dL — AB (ref 65–99)
GLUCOSE-CAPILLARY: 238 mg/dL — AB (ref 65–99)
Glucose-Capillary: 91 mg/dL (ref 65–99)
Glucose-Capillary: 105 mg/dL — ABNORMAL HIGH (ref 65–99)

## 2016-12-16 LAB — BASIC METABOLIC PANEL
Anion gap: 8 (ref 5–15)
BUN: 23 mg/dL — AB (ref 6–20)
CHLORIDE: 103 mmol/L (ref 101–111)
CO2: 26 mmol/L (ref 22–32)
CREATININE: 1.28 mg/dL — AB (ref 0.61–1.24)
Calcium: 9.1 mg/dL (ref 8.9–10.3)
GFR calc Af Amer: >60 mL/min (ref >60)
GFR calc non Af Amer: 54 mL/min — ABNORMAL LOW (ref >60)
GLUCOSE: 114 mg/dL — AB (ref 65–99)
POTASSIUM: 4.5 mmol/L (ref 3.5–5.1)
SODIUM: 137 mmol/L (ref 135–145)

## 2016-12-16 LAB — CBC WITH DIFFERENTIAL/PLATELET
Basophils Absolute: 0.1 10*3/uL (ref 0.0–0.1)
Basophils Relative: 1 %
EOS ABS: 0.2 10*3/uL (ref 0.0–0.7)
EOS PCT: 1 %
HCT: 43.2 % (ref 39.0–52.0)
Hemoglobin: 14 g/dL (ref 13.0–17.0)
LYMPHS ABS: 2.6 10*3/uL (ref 0.7–4.0)
LYMPHS PCT: 20 %
MCH: 27.8 pg (ref 26.0–34.0)
MCHC: 32.4 g/dL (ref 30.0–36.0)
MCV: 85.7 fL (ref 78.0–100.0)
Monocytes Absolute: 1.8 10*3/uL — ABNORMAL HIGH (ref 0.1–1.0)
Monocytes Relative: 13 %
Neutro Abs: 8.4 10*3/uL — ABNORMAL HIGH (ref 1.7–7.7)
Neutrophils Relative %: 65 %
PLATELETS: 149 10*3/uL — AB (ref 150–400)
RBC: 5.04 MIL/uL (ref 4.22–5.81)
RDW: 15.4 % (ref 11.5–15.5)
WBC: 13 10*3/uL — ABNORMAL HIGH (ref 4.0–10.5)

## 2016-12-16 LAB — PROTIME-INR
INR: 5.02
PROTHROMBIN TIME: 48 s — AB (ref 11.4–15.2)

## 2016-12-16 LAB — ECHOCARDIOGRAM COMPLETE
Height: 69 in
WEIGHTICAEL: 3022.4 [oz_av]

## 2016-12-16 LAB — PROCALCITONIN: Procalcitonin: 0.11 ng/mL

## 2016-12-16 MED ORDER — PNEUMOCOCCAL VAC POLYVALENT 25 MCG/0.5ML IJ INJ
0.5000 mL | INJECTION | INTRAMUSCULAR | Status: AC
  Administered 2016-12-17: 0.5 mL via INTRAMUSCULAR
  Filled 2016-12-16: qty 0.5

## 2016-12-16 MED ORDER — PERFLUTREN LIPID MICROSPHERE
1.0000 mL | INTRAVENOUS | Status: AC | PRN
  Administered 2016-12-16: 2 mL via INTRAVENOUS
  Filled 2016-12-16: qty 10

## 2016-12-16 MED ORDER — WARFARIN - PHARMACIST DOSING INPATIENT: Freq: Every day | Status: DC

## 2016-12-16 MED ORDER — COLLAGENASE 250 UNIT/GM EX OINT
TOPICAL_OINTMENT | Freq: Every day | CUTANEOUS | Status: DC
  Administered 2016-12-16: 21:00:00 via TOPICAL
  Administered 2016-12-17: 1 via TOPICAL
  Administered 2016-12-18 – 2016-12-19 (×2): via TOPICAL
  Filled 2016-12-16: qty 30

## 2016-12-16 NOTE — ED Notes (Signed)
MD made aware of critical labs

## 2016-12-16 NOTE — Progress Notes (Signed)
Walked into pt's room and found the pt's IV hub had been disconnected and was bleeding at the site. Upon further inspection we found the pt's bottom bed sheet soaked in blood and had also gone onto the floor forming an approximate 64ft x 6ft pool of blood. Pressure was immediately applied to site and the bleeding stopped. Vitals were obtained, pt was stable and asymptomatic.  Morning labs obtained Hgb now 14. Bed alarm on, call light within reach, will continue to monitor.

## 2016-12-16 NOTE — Progress Notes (Signed)
ANTICOAGULATION CONSULT NOTE - Initial Consult  Pharmacy Consult for Warfarin  Indication: Unclear, pt states "weak heart", pt from out of town  No Known Allergies  Patient Measurements: Height: 5\' 9"  (175.3 cm) Weight: 188 lb 14.4 oz (85.7 kg) IBW/kg (Calculated) : 70.7 Vital Signs: Temp: 97.7 F (36.5 C) (12/22 0045) Temp Source: Oral (12/22 0045) BP: 112/72 (12/22 0045) Pulse Rate: 79 (12/22 0045)  Labs:  Recent Labs  12/15/16 1816 12/15/16 2031  HGB 15.2  --   HCT 46.8  --   PLT 139*  --   LABPROT  --  44.6*  INR  --  4.54*  CREATININE 1.27*  --     Estimated Creatinine Clearance: 56.2 mL/min (by C-G formula based on SCr of 1.27 mg/dL (H)).   Medical History: Past Medical History:  Diagnosis Date  . Cancer ([place])   . CHF (congestive heart failure) ([place])   . COPD (chronic obstructive pulmonary disease) ([place])   . Coronary artery disease   . Diabetes mellitus without complication ([place])   . Hyperlipidemia   . Hypertension     Assessment: 75 y/o M from [place] here with dyspnea and cough, on warfarin PTA for ?, pt tells MD that it is for "weak heart". INR is supra-therapeutic on admit at 4.54. However, CT Angio was obtained and the following was found: Evaluation of the pulmonary arteries is limited due to suboptimal opacification of the peripheral branches.There is however decreased opacification of the segmental branch point and subsegmental branches of the right lower lobe pulmonary artery (series 5 image 164- 195) most suspicious for pulmonary artery embolus.   Goal of Therapy:  INR 2-3 Monitor platelets by anticoagulation protocol: Yes   Plan (as discussed with Dr. Myna Hidalgo): -No warfarin with elevated INR -Daily PT/INR -Given this is not a definitive new PE, wait until AM for further evaluation with dopplers to make sure pt doesn't have any new DVT. If DVT present, would definitely need alternative anti-coagulation  -Possible hematology consult per  MD -F/U indication for PTA warfarin   Narda Bonds 12/16/2016,1:28 AM

## 2016-12-16 NOTE — Progress Notes (Signed)
PROGRESS NOTE                                                                                                                                                                                                             Patient Demographics:    Edward Meyer, is a 75 y.o. male, DOB - 1941/09/25, PH:[id number]  Admit date - 12/15/2016   Admitting Physician Vianne Bulls, MD  Outpatient Primary MD for the patient is Pcp Not In System  LOS - 0  Outpatient Specialists: Follows in [facility]  Chief Complaint  Patient presents with  . Shortness of Breath       Brief Narrative   75 year old male with history of coronary artery disease, chronic CHF, COPD on home O2 at night, insulin-dependent diabetes mellitus, hypertension and cirrhosis of liver presented to the ED with 3 day history of dyspnea with productive cough and upper back/left shoulder pain. He is visiting his daughter for the holidays from [place]. He arrived here 3 days prior to admission. During the overlying [place] he developed acute GI upset with nausea, vomiting and diarrhea, which have now resolved. However he developed increased shortness of breath and productive cough. Denies any fevers or chills, chest pain or palpitations. He developed sharp pain in his upper back and posterior shoulder worsened with deep inspiration and coughing. He denied any abdominal pain, bowel or urinary symptoms. Patient is on chronic warfarin for unclear reason. Patient informs having chronic leg swellings and pain in the left leg associated with ulcers. Patient informs being ambulatory to a very short distance and gets tired and short of breath easily.  In the ED Patient was afebrile, maintaining O2 sat on room air with stable other vitals. EKG showed PVCs. Chest x-ray unremarkable. Labs showed creatinine 1.27., Mild thrombocytopenia and elevated BNP of 454. Troponin was normal.  INR supratherapeutic at 4.54. CT of the cervical spine showed chronic degenerative changes without acute findings. CT angiogram of the chest was suggestive of possible pulmonary embolism in right lower lobe segmental and subsegmental branches as well as focal infrahilar and lingular consolidation concerning for pneumonia. Patient received 1 L normal saline bolus and 4 mg IV morphine and was observed on telemetry.      Subjective:   Patient seen and examined this morning. Informs his breathing and cough to be  much better.   Assessment  & Plan :    Principal Problem:   ?Acute pulmonary embolism ([place]) As shown on CT angiogram on admission. Doppler of the bilateral lower extremities was negative for DVT. 2-D echo showed reduced EF but no right heart strain. -Patient has no further dyspnea or cough. Never had chest pain symptoms. His INR remains supratherapeutic (>5 today). -I discussed CT findings with the radiologist Dr. Leroy Sea again today. Upon discussing the clinical picture and supratherapeutic INR on Coumadin he suggested that this could well be an artifact on the right lung secondary to slow pulmonary flow due to low EF. -Since his respiratory symptoms are stable with negative lower extremity Doppler and no right heart strain I doubt patient has acute PE and unlikely failure on Coumadin with supratherapeutic INR. -If patient has worsened symptoms will need to consult PCCM.  Left lobar pneumonia ([place]) On empiric Rocephin. I have discontinued azithromycin given supratherapeutic INR. Supportive care with Tylenol and antitussives. Possibly discharge on Augmentin. Strep pneumonia antigen pending. Blood culture not sent on admission.   Chronic systolic CHF -Patient is from [place] so no results available to compare. 2-D echo showed EF of 20-25% with diffuse hypokinesis, moderate MR suppressed RV systolic function and severely increased PA pressure of 68 mmHg. -Appears euvolemic.  Continue daily Lasix. Monitor I/O. Continue Imdur and statin. -Resume Coreg and lisinopril.  Coronary artery disease Continue Imdur and Zocor. Resume coreg  Insulin-dependent diabetes mellitus Resume home dose Lantus. Monitor CBG with slight scale coverage.  Essential hypertension Soft blood pressure on admission. Now stable. Resume all home medications  ?chronic kidney disease No baseline available.   Venous Stasis with ulcers Chronic. Appreciate wound care evaluation. Added enzymatic debridement ointment.  Supratherapeutic INR On chronic warfarin. Dosing per pharmacy.   Code Status : No code  Family Communication  : Discussed with daughter on the phone  Disposition Plan  : Home tomorrow if breathing continues to improve  Barriers For Discharge : Improving symptoms  Consults  :  None  Procedures  :  CT angiogram of the chest CT of the cervical spine 2-D echo and Doppler lower extremity  DVT Prophylaxis  :  warfarin  Lab Results  Component Value Date   PLT 149 (L) 12/16/2016    Antibiotics  :    Anti-infectives    Start     Dose/Rate Route Frequency Ordered Stop   12/15/16 2330  cefTRIAXone (ROCEPHIN) 1 g in dextrose 5 % 50 mL IVPB     1 g 100 mL/hr over 30 Minutes Intravenous Daily at bedtime 12/15/16 2323 12/22/16 2159   12/15/16 2330  azithromycin (ZITHROMAX) tablet 500 mg  Status:  Discontinued     500 mg Oral Daily at bedtime 12/15/16 2323 12/16/16 0756        Objective:   Vitals:   12/15/16 2345 12/16/16 0029 12/16/16 0045 12/16/16 0450  BP: 108/66  112/72 (!) 102/55  Pulse: 80  79 91  Resp: 12  18 17   Temp:   97.7 F (36.5 C) 98.1 F (36.7 C)  TempSrc:   Oral Oral  SpO2: 95%  98% 97%  Weight:  85.7 kg (188 lb 14.4 oz)    Height:  5\' 9"  (1.753 m)      Wt Readings from Last 3 Encounters:  12/16/16 85.7 kg (188 lb 14.4 oz)     Intake/Output Summary (Last 24 hours) at 12/16/16 1451 Last data filed at 12/16/16 0846  Gross per 24  hour    Intake             1240 ml  Output              600 ml  Net              640 ml     Physical Exam  Gen: Elderly male not in distress HEENT:  moist mucosa, supple neck Chest: clear b/l, no added sounds CVS: Normal S1 and S2, no murmurs or gallop GI: soft, NT, ND,  Musculoskeletal: warm, trace edema bilaterally, chronic venous stasis ulcer CNS: Alert and oriented    Data Review:    CBC  Recent Labs Lab 12/15/16 1816 12/16/16 0523  WBC 10.0 13.0*  HGB 15.2 14.0  HCT 46.8 43.2  PLT 139* 149*  MCV 85.9 85.7  MCH 27.9 27.8  MCHC 32.5 32.4  RDW 15.1 15.4  LYMPHSABS  --  2.6  MONOABS  --  1.8*  EOSABS  --  0.2  BASOSABS  --  0.1    Chemistries   Recent Labs Lab 12/15/16 1816 12/16/16 0523  NA 137 137  K 4.1 4.5  CL 101 103  CO2 25 26  GLUCOSE 136* 114*  BUN 21* 23*  CREATININE 1.27* 1.28*  CALCIUM 9.4 9.1   ------------------------------------------------------------------------------------------------------------------ No results for input(s): CHOL, HDL, LDLCALC, TRIG, CHOLHDL, LDLDIRECT in the last 72 hours.  No results found for: HGBA1C ------------------------------------------------------------------------------------------------------------------ No results for input(s): TSH, T4TOTAL, T3FREE, THYROIDAB in the last 72 hours.  Invalid input(s): FREET3 ------------------------------------------------------------------------------------------------------------------ No results for input(s): VITAMINB12, FOLATE, FERRITIN, TIBC, IRON, RETICCTPCT in the last 72 hours.  Coagulation profile  Recent Labs Lab 12/15/16 2031 12/16/16 0523  INR 4.54* 5.02*    No results for input(s): DDIMER in the last 72 hours.  Cardiac Enzymes No results for input(s): CKMB, TROPONINI, MYOGLOBIN in the last 168 hours.  Invalid input(s): CK ------------------------------------------------------------------------------------------------------------------    Component  Value Date/Time   BNP 453.6 (H) 12/15/2016 1816    Inpatient Medications  Scheduled Meds: . cefTRIAXone (ROCEPHIN)  IV  1 g Intravenous QHS  . collagenase   Topical Daily  . furosemide  160 mg Oral Daily  . insulin aspart  0-5 Units Subcutaneous QHS  . insulin aspart  0-9 Units Subcutaneous TID WC  . insulin glargine  18 Units Subcutaneous QHS  . isosorbide mononitrate  20 mg Oral BID  . [START ON 12/17/2016] pneumococcal 23 valent vaccine  0.5 mL Intramuscular Tomorrow-1000  . simvastatin  40 mg Oral q1800  . sodium chloride flush  3 mL Intravenous Q12H   Continuous Infusions: PRN Meds:.sodium chloride, acetaminophen, HYDROcodone-acetaminophen, ipratropium-albuterol, sodium chloride flush  Micro Results No results found for this or any previous visit (from the past 240 hour(s)).  Radiology Reports Dg Chest 2 View  Result Date: 12/15/2016 CLINICAL DATA:  Productive cough with shortness of breath for 3 days. Pain in the neck, both shoulders and upper back. History of asthma, pacemaker and diabetes. EXAM: CHEST  2 VIEW COMPARISON:  None. FINDINGS: Left subclavian AICD leads are present within the right atrium and right ventricle. The heart size is normal status post median sternotomy. There is aortic atherosclerosis. The lungs appear clear. There is no pleural effusion or pneumothorax. EKG snap overlies the upper right chest. Mild thoracic spine degenerative changes are noted. IMPRESSION: No acute cardiopulmonary process or explanation for the patient's symptoms identified. Electronically Signed   By: Richardean Sale M.D.   On: 12/15/2016 18:45  Ct Angio Chest Pe W Or Wo Contrast  Result Date: 12/15/2016 CLINICAL DATA:  75 year old male with shortness of breath for several years. History of COPD, coronary artery disease, and CHF. EXAM: CT ANGIOGRAPHY CHEST WITH CONTRAST TECHNIQUE: Multidetector CT imaging of the chest was performed using the standard protocol during bolus  administration of intravenous contrast. Multiplanar CT image reconstructions and MIPs were obtained to evaluate the vascular anatomy. CONTRAST:  80 cc Isovue 370 COMPARISON:  Chest radiograph dated 12/15/2016 FINDINGS: Cardiovascular: There is moderate cardiomegaly with dilatation of the right atrium. There is retrograde flow of contrast from the right atrium into the IVC compatible with a degree of right cardiac dysfunction. Correlation with echocardiogram recommended. There is advanced coronary vascular calcification involving the left main, LAD, left circumflex artery, and to lesser degree RCA. There is no pericardial effusion. Left pectoral dual lead AICD device with leads in the right atrium and right ventricle. There is moderate atherosclerotic calcification of the thoracic aorta. There is no aneurysmal dilatation. Evaluation of the pulmonary arteries is limited due to suboptimal opacification of the peripheral branches. There is however decreased opacification of the segmental branch point and subsegmental branches of the right lower lobe pulmonary artery (series 5 image 164- 195) most suspicious for pulmonary artery embolus. Mediastinum/Nodes: There is no hilar or mediastinal adenopathy. The esophagus is grossly unremarkable. No mediastinal fluid collection. No thyroid nodules identified. Lungs/Pleura: Mild centrilobular emphysema. There is diffuse chronic interstitial coarsening and hazy ground-glass density throughout the lungs. No interstitial edema. There is a focal area of consolidation in the left infrahilar region within the lingula concerning for pneumonia. A focal pulmonary infarct is less likely. There is a small right pleural effusion with associated mild compressive atelectasis of the right lower lobe. Pneumonia is less likely. There is no pneumothorax. The central airways are patent. Upper Abdomen: There is irregularity of the hepatic contour compatible with cirrhosis. Small perihepatic free  fluid noted. The visualized upper abdomen is otherwise unremarkable. Musculoskeletal: Degenerative changes of the spine with multilevel osteophyte. No acute fracture. Median sternotomy wires noted and appear intact. Review of the MIP images confirms the above findings. IMPRESSION: Non opacification of the segmental branch point and subsegmental branches of the right lower lobe. Although there is suboptimal opacification of the distal pulmonary artery branches, findings are suspicious for PE. Focal left infrahilar/lingular consolidative change concerning for pneumonia. The pulmonary infarct is less likely. Clinical correlation is recommended. Moderate cardiomegaly with multivessel coronary vascular disease and evidence of right cardiac dysfunction. Correlation with echocardiogram recommended. Small right pleural effusion. Cirrhosis with partially visualized small perihepatic ascites. These results were called by telephone at the time of interpretation on 12/15/2016 at 10:13 pm to Dr. Veryl Speak , who verbally acknowledged these results. Electronically Signed   By: Anner Crete M.D.   On: 12/15/2016 22:21   Ct Cervical Spine Wo Contrast  Result Date: 12/15/2016 CLINICAL DATA:  75 year old male with neck pain. EXAM: CT CERVICAL SPINE WITHOUT CONTRAST TECHNIQUE: Multidetector CT imaging of the cervical spine was performed without intravenous contrast. Multiplanar CT image reconstructions were also generated. COMPARISON:  None. FINDINGS: Alignment: Normal. Skull base and vertebrae: No acute fracture. No primary bone lesion or focal pathologic process. Soft tissues and spinal canal: No prevertebral fluid or swelling. No visible canal hematoma. Disc levels: There is degenerative changes. Disc osteophyte complex with moderate narrowing of the left neural foramina at C5-C6. The remainder of the neural foramina as well as the central canal appear widely patent.  Upper chest: Negative. Cardiac pacemaker wires  partially visualized in the left chest wall. Other: None IMPRESSION: No acute/ traumatic cervical spine pathology. Degenerative changes with disc osteophyte complex and moderate narrowing of the left neural foramina at C5-C6. Electronically Signed   By: Anner Crete M.D.   On: 12/15/2016 21:58    Time Spent in minutes  25   Louellen Molder M.D on 12/16/2016 at 2:51 PM  Between 7am to 7pm - Pager - [phone]  After 7pm go to [email or web address] - password [facility]  [facility] -  Office  [phone]

## 2016-12-16 NOTE — Progress Notes (Signed)
*  PRELIMINARY RESULTS* Vascular Ultrasound Bilateral lower extremity venous duplex has been completed.  Preliminary findings:  No evidence of deep vein thrombosis or baker's cysts bilaterally. Incidentally noted: a possible right femoral artery graft or stent and a possibly occluded left superficial femoral artery.   Myrtie Cruise Quanasia Defino 12/16/2016, 11:00 AM

## 2016-12-16 NOTE — Progress Notes (Signed)
ANTICOAGULATION CONSULT NOTE - Follow Up Consult  Pharmacy Consult for Coumadin Indication: reason unclear. pt states "weak heart", from out of town  No Known Allergies  Patient Measurements: Height: 5\' 9"  (175.3 cm) Weight: 188 lb 14.4 oz (85.7 kg) IBW/kg (Calculated) : 70.7  Vital Signs: Temp: 99.1 F (37.3 C) (12/22 1452) Temp Source: Oral (12/22 1452) BP: 113/46 (12/22 1452) Pulse Rate: 87 (12/22 1452)  Labs:  Recent Labs  12/15/16 1816 12/15/16 2031 12/16/16 0523  HGB 15.2  --  14.0  HCT 46.8  --  43.2  PLT 139*  --  149*  LABPROT  --  44.6* 48.0*  INR  --  4.54* 5.02*  CREATININE 1.27*  --  1.28*    Estimated Creatinine Clearance: 55.8 mL/min (by C-G formula based on SCr of 1.28 mg/dL (H)).  Assessment:   75 yr old male from [place] town admitted last night with dyspnea and cough.   INR supratherapeutic on admit - 4.54.  Up to 5.02 today.  Unclear indication for Coumadin.  Patient reported "for weak heart" last night, and tells me today "so I won't have a heart attack".  Hx pacer and ICD - wonder if hx afib? Also has chronic leg swelling. Duplex negative for DVT.   CTA suggestive of PE.  Noted Dr. Clementeen Graham d/w Dr. Leroy Sea, and could be atrifact on right lung secondary to slow pulmonary flow due to low EF (20-25%), and doubt acute PE and unlikely Coumadin failure.   Significant bleeding this morning, from loss of right antecubital IV site. Patient reports no further bleeding.  Small bruise at site.   Hx cirrhosis. Will check LFTs in am.  Goal of Therapy:  INR 2-3 Monitor platelets by anticoagulation protocol: Yes   Plan:   No Coumadin today.  Daily PT/INR.    Bmet changed to cmp for am, to check LFTs.  Arty Baumgartner, [place] Pager: [phone] 12/16/2016,4:49 PM

## 2016-12-16 NOTE — Consult Note (Signed)
[facility] Nurse wound consult note Reason for Consult: venous stasis ulcers However patient has weak palpable pulse and no edema.  I have been able to doppler pulses easily in the LE. Venous stasis ulcers most often exudative in the presence of edema and skin color changes.  He does not have any of these findings.  He reports trauma to the knee with a fall and I wonder if the other areas are also from trauma Wound type: mixed etiology partial thickness ulcerations x 4 3 pretibial and 1 on the knee.  All are scabbed and not draining  Pressure Ulcer POA: No Measurement: 1cm x 1cm x 0cm ; 1.5cm x 2cm x 0cm , 2cm x 3cm x 0cm and 0.5cm x 0.5cm x 0 Wound bed: all scabbed, crust  Drainage (amount, consistency, odor) none Periwound: dry skin  Dressing procedure/placement/frequency: Will add enzymatic debridement ointment to clean the wound beds, would recommend follow up with primary care MD to check status of wounds in a few weeks. Nursing to teach patient and family to apply Santyl daily and cover with large bandaid or gauze.   Discussed POC with patient and bedside nurse.  Re consult if needed, will not follow at this time. Thanks  Kamarrion Stfort [employer], RN,CWOCN, CNS [phone])

## 2016-12-17 DIAGNOSIS — I2609 Other pulmonary embolism with acute cor pulmonale: Secondary | ICD-10-CM

## 2016-12-17 LAB — COMPREHENSIVE METABOLIC PANEL
ALT: 17 U/L (ref 17–63)
ANION GAP: 7 (ref 5–15)
AST: 25 U/L (ref 15–41)
Albumin: 3 g/dL — ABNORMAL LOW (ref 3.5–5.0)
Alkaline Phosphatase: 65 U/L (ref 38–126)
BUN: 20 mg/dL (ref 6–20)
CO2: 27 mmol/L (ref 22–32)
CREATININE: 1.27 mg/dL — AB (ref 0.61–1.24)
Calcium: 8.5 mg/dL — ABNORMAL LOW (ref 8.9–10.3)
Chloride: 102 mmol/L (ref 101–111)
GFR, EST NON AFRICAN AMERICAN: 54 mL/min — AB (ref >60)
Glucose, Bld: 72 mg/dL (ref 65–99)
POTASSIUM: 3.6 mmol/L (ref 3.5–5.1)
Sodium: 136 mmol/L (ref 135–145)
Total Bilirubin: 1.2 mg/dL (ref 0.3–1.2)
Total Protein: 5.8 g/dL — ABNORMAL LOW (ref 6.5–8.1)

## 2016-12-17 LAB — GLUCOSE, CAPILLARY
GLUCOSE-CAPILLARY: 194 mg/dL — AB (ref 65–99)
Glucose-Capillary: 69 mg/dL (ref 65–99)
Glucose-Capillary: 178 mg/dL — ABNORMAL HIGH (ref 65–99)
Glucose-Capillary: 233 mg/dL — ABNORMAL HIGH (ref 65–99)

## 2016-12-17 LAB — CBC
HEMATOCRIT: 40.4 % (ref 39.0–52.0)
Hemoglobin: 13.2 g/dL (ref 13.0–17.0)
MCH: 27.8 pg (ref 26.0–34.0)
MCHC: 32.7 g/dL (ref 30.0–36.0)
MCV: 85.1 fL (ref 78.0–100.0)
PLATELETS: 142 10*3/uL — AB (ref 150–400)
RBC: 4.75 MIL/uL (ref 4.22–5.81)
RDW: 15.2 % (ref 11.5–15.5)
WBC: 9.1 10*3/uL (ref 4.0–10.5)

## 2016-12-17 LAB — PROTIME-INR
INR: 3.82
Prothrombin Time: 38.6 seconds — ABNORMAL HIGH (ref 11.4–15.2)

## 2016-12-17 LAB — PROCALCITONIN: PROCALCITONIN: 0.14 ng/mL

## 2016-12-17 MED ORDER — CARVEDILOL 6.25 MG PO TABS
6.2500 mg | ORAL_TABLET | Freq: Two times a day (BID) | ORAL | Status: DC
  Administered 2016-12-17 – 2016-12-19 (×4): 6.25 mg via ORAL
  Filled 2016-12-17 (×4): qty 1

## 2016-12-17 MED ORDER — ONDANSETRON HCL 4 MG/2ML IJ SOLN
4.0000 mg | Freq: Four times a day (QID) | INTRAMUSCULAR | Status: DC | PRN
  Administered 2016-12-17: 4 mg via INTRAVENOUS

## 2016-12-17 MED ORDER — INSULIN GLARGINE 100 UNIT/ML ~~LOC~~ SOLN
10.0000 [IU] | Freq: Every day | SUBCUTANEOUS | Status: DC
  Administered 2016-12-17 – 2016-12-18 (×2): 10 [IU] via SUBCUTANEOUS
  Filled 2016-12-17 (×2): qty 0.1

## 2016-12-17 MED ORDER — SOTALOL HCL 80 MG PO TABS
80.0000 mg | ORAL_TABLET | Freq: Two times a day (BID) | ORAL | Status: DC
  Administered 2016-12-17 – 2016-12-19 (×5): 80 mg via ORAL
  Filled 2016-12-17 (×6): qty 1

## 2016-12-17 MED ORDER — POTASSIUM CHLORIDE CRYS ER 20 MEQ PO TBCR: 40.0000 meq | EXTENDED_RELEASE_TABLET | Freq: Once | ORAL | Status: DC

## 2016-12-17 MED ORDER — POTASSIUM CHLORIDE CRYS ER 20 MEQ PO TBCR
20.0000 meq | EXTENDED_RELEASE_TABLET | Freq: Once | ORAL | Status: AC
  Administered 2016-12-17: 20 meq via ORAL
  Filled 2016-12-17: qty 1

## 2016-12-17 NOTE — Progress Notes (Addendum)
ANTICOAGULATION CONSULT NOTE - Follow Up Consult  Pharmacy Consult for Coumadin Indication: unclear initially (weak heart per pt) > now with new afib   No Known Allergies  Patient Measurements: Height: 5\' 9"  (175.3 cm) Weight: 186 lb 11.2 oz (84.7 kg) IBW/kg (Calculated) : 70.7  Vital Signs: Temp: 97.5 F (36.4 C) (12/23 0355) Temp Source: Oral (12/23 0355) BP: 140/60 (12/23 0355) Pulse Rate: 96 (12/23 0355)  Labs:  Recent Labs  12/15/16 1816 12/15/16 2031 12/16/16 0523 12/17/16 0523 12/17/16 0836  HGB 15.2  --  14.0  --  13.2  HCT 46.8  --  43.2  --  40.4  PLT 139*  --  149*  --  142*  LABPROT  --  44.6* 48.0*  --  38.6*  INR  --  4.54* 5.02*  --  3.82  CREATININE 1.27*  --  1.28* 1.27*  --     Estimated Creatinine Clearance: 51.8 mL/min (by C-G formula based on SCr of 1.27 mg/dL (H)).  Assessment: 75 yr old male from [place] town admitted with dyspnea and cough. On coumadin PTA for unknown reason. INR supratherapeutic on admit - 4.54 > 5.02. CTA suggestive of PE, however, cannot r/o artifact. Duplex negative for DVT. New afib noted on monitor 12/23. Significant bleeding 12/22 from disconnected IV. No new labs today. CBC/INR ordered for now and daily.   Goal of Therapy:  INR 2-3 Monitor platelets by anticoagulation protocol: Yes   Plan:   No Coumadin today.  Daily PT/INR.    Monitor for s/s bleeding  F/u resuming AC   Addendum: INR this AM 3.82. No Coumadin today.   Argie Ramming, PharmD Pharmacy Resident  Pager [phone] 12/17/16 7:46 AM

## 2016-12-17 NOTE — Progress Notes (Signed)
PROGRESS NOTE                                                                                                                                                                                                             Patient Demographics:    Edward Meyer, is a 75 y.o. male, DOB - [birth year], [id number]  Admit date - 12/15/2016   Admitting Physician Vianne Bulls, MD  Outpatient Primary MD for the patient is Pcp Not In System  LOS - 1  Outpatient Specialists: Follows in [facility]  Chief Complaint  Patient presents with  . Shortness of Breath       Brief Narrative   75 year old male with history of coronary artery disease, chronic CHF, COPD on home O2 at night, insulin-dependent diabetes mellitus, hypertension and cirrhosis of liver presented to the ED with 3 day history of dyspnea with productive cough and upper back/left shoulder pain. He is visiting his daughter for the holidays from [place]. He arrived here 3 days prior to admission. During the overlying [place] he developed acute GI upset with nausea, vomiting and diarrhea, which have now resolved. However he developed increased shortness of breath and productive cough. Denies any fevers or chills, chest pain or palpitations. He developed sharp pain in his upper back and posterior shoulder worsened with deep inspiration and coughing. He denied any abdominal pain, bowel or urinary symptoms. Patient is on chronic warfarin for unclear reason. Patient informs having chronic leg swellings and pain in the left leg associated with ulcers. Patient informs being ambulatory to a very short distance and gets tired and short of breath easily.  In the ED Patient was afebrile, maintaining O2 sat on room air with stable other vitals. EKG showed PVCs. Chest x-ray unremarkable. Labs showed creatinine 1.27., Mild thrombocytopenia and elevated BNP of 454. Troponin was normal.  INR supratherapeutic at 4.54. CT of the cervical spine showed chronic degenerative changes without acute findings. CT angiogram of the chest was suggestive of possible pulmonary embolism in right lower lobe segmental and subsegmental branches as well as focal infrahilar and lingular consolidation concerning for pneumonia. Patient received 1 L normal saline bolus and 4 mg IV morphine and was observed on telemetry.   Subjective:   Patient seen and examined this morning. Informs his breathing and cough to be much better.  Assessment  & Plan :   Principal Problem:  ?Acute pulmonary embolism ([place]) - shown on CT angio on admission. Doppler of the bilateral lower extremities was negative for DVT. 2-D echo showed reduced EF but no right heart strain. - INR supratherapeutic on admission  - Dr. Clementeen Graham d/w radiologist, determined this is likely artifact  - pt still with exertional dyspnea and suspect this to be related to left lobar PNA  Acute on chronic respiratory failure with hypoxia due to Left lobar pneumonia ([place]), chronic systolic CHF - On empiric Rocephin - pt oxygen dependent at baseline 2 L [place] - Strep pneumonia antigen pending. Blood culture not sent on admission.  Chronic systolic CHF - Patient is from [place] so no results available to compare. - 2-D echo showed EF of 20-25% with diffuse hypokinesis, moderate MR suppressed RV systolic function and severely increased PA pressure of 68 mmHg, per daughter this is his baseline  - Appears euvolemic. Continue Edward Lasix. Monitor I/O. Continue Imdur and statin.  - Resume Coreg and sotalol per home medical regimen  - hold lisinopril until renal function stabilizes   Coronary artery disease - Continue Imdur and Zocor. 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 chronic kidney disease, stage II - III - No baseline available - Cr stable ~ 1.2 - BMP in AM  Venous Stasis with ulcers - Chronic. Appreciate wound care evaluation.  - Added enzymatic debridement ointment.  Supratherapeutic INR - On chronic warfarin. Dosing per pharmacy. - INR trending toward target range   Code Status : No code  Family Communication  : Discussed with daughter on the phone  Disposition Plan  : Home tomorrow if breathing continues to improve  Barriers For Discharge : Improving symptoms  Consults  :  None  Procedures  :  CT angiogram of the chest CT of the cervical spine 2-D echo and Doppler lower extremity  DVT Prophylaxis  :  warfarin  Lab Results  Component Value Date   PLT 142 (L) 12/17/2016    Antibiotics  :    Anti-infectives    Start     Dose/Rate Route Frequency Ordered Stop   12/15/16 2330  cefTRIAXone (ROCEPHIN) 1 g in dextrose 5 % 50 mL IVPB     1 g 100 mL/hr over 30 Minutes Intravenous Edward at bedtime 12/15/16 2323 12/22/16 2159   12/15/16 2330  azithromycin (ZITHROMAX) tablet 500 mg  Status:  Discontinued     500 mg Oral Edward at bedtime 12/15/16 2323 12/16/16 0756        Objective:   Vitals:   12/16/16 2300 12/17/16 0107 12/17/16 0355 12/17/16 0500  BP:  99/69 140/60   Pulse: 65 92 96   Resp: (!) 24  17   Temp: 97.7 F (36.5 C)  97.5 F (36.4 C)   TempSrc: Oral  Oral   SpO2: 98%  97%   Weight:    84.7 kg (186 lb 11.2 oz)  Height:        Wt Readings from Last 3 Encounters:  12/17/16 84.7 kg (186 lb 11.2 oz)     Intake/Output Summary (Last 24 hours) at 12/17/16 1158 Last data filed at 12/17/16 0800  Gross per 24 hour  Intake              580 ml  Output             1125 ml  Net             -545 ml   Physical Exam  Gen: Elderly male not in distress HEENT:  moist mucosa, supple neck Chest: diminished breath sounds at bases    CVS: Normal S1 and S2, no gallop GI: soft, NT, ND,  Musculoskeletal: warm, trace edema bilaterally, chronic venous stasis ulcer CNS: Alert and oriented   Data Review:    CBC  Recent Labs Lab 12/15/16 1816 12/16/16 0523 12/17/16 0836  WBC 10.0 13.0* 9.1  HGB 15.2 14.0 13.2  HCT 46.8 43.2 40.4  PLT 139* 149* 142*  MCV 85.9 85.7 85.1  MCH 27.9 27.8 27.8  MCHC 32.5 32.4 32.7  RDW 15.1 15.4 15.2  LYMPHSABS  --  2.6  --   MONOABS  --  1.8*  --   EOSABS  --  0.2  --   BASOSABS  --  0.1  --     Chemistries   Recent Labs Lab 12/15/16 1816 12/16/16 0523 12/17/16 0523  NA 137 137 136  K 4.1 4.5 3.6  CL 101 103 102  CO2 25 26 27   GLUCOSE 136* 114* 72  BUN 21* 23* 20  CREATININE 1.27* 1.28* 1.27*  CALCIUM 9.4 9.1 8.5*  AST  --   --  25  ALT  --   --  17  ALKPHOS  --   --  65  BILITOT  --   --  1.2   Coagulation profile  Recent Labs Lab 12/15/16 2031 12/16/16 0523 12/17/16 0836  INR 4.54* 5.02* 3.82   ------------------------------------------------------------------------------------------------------------------    Component Value Date/Time   BNP 453.6 (H) 12/15/2016 1816    Inpatient Medications  Scheduled Meds: . cefTRIAXone (ROCEPHIN)  IV  1 g Intravenous QHS  . collagenase   Topical Edward  . furosemide  160 mg Oral Edward  . insulin aspart  0-5 Units Subcutaneous QHS  . insulin aspart  0-9 Units Subcutaneous TID WC  . insulin glargine  18 Units Subcutaneous QHS  . isosorbide mononitrate  20 mg Oral BID  . simvastatin  40 mg Oral q1800  . sodium chloride flush  3 mL Intravenous Q12H  . Warfarin - Pharmacist Dosing Inpatient   Does not apply q1800   Continuous Infusions: PRN Meds:.sodium chloride, acetaminophen, HYDROcodone-acetaminophen, ipratropium-albuterol, sodium chloride flush  Micro Results No results found for this or any previous visit (from the past 240 hour(s)).  Radiology Reports Dg Chest 2 View  Result Date:  12/15/2016 CLINICAL DATA:  Productive cough with shortness of breath for 3 days. Pain in the neck, both shoulders and upper back. History of asthma, pacemaker and diabetes. EXAM: CHEST  2 VIEW COMPARISON:  None. FINDINGS: Left subclavian AICD leads are present within the right atrium and right ventricle. The heart size is normal status post median sternotomy. There is aortic atherosclerosis. The lungs appear clear. There is no pleural effusion or pneumothorax. EKG snap overlies the upper right chest. Mild thoracic spine degenerative changes are noted. IMPRESSION: No acute cardiopulmonary process or explanation for the patient's symptoms identified. Electronically Signed   By: Richardean Sale M.D.   On: 12/15/2016 18:45   Ct Angio Chest Pe W Or Wo Contrast  Result Date: 12/15/2016 CLINICAL DATA:  75 year old male with shortness of breath for several  years. History of COPD, coronary artery disease, and CHF. EXAM: CT ANGIOGRAPHY CHEST WITH CONTRAST TECHNIQUE: Multidetector CT imaging of the chest was performed using the standard protocol during bolus administration of intravenous contrast. Multiplanar CT image reconstructions and MIPs were obtained to evaluate the vascular anatomy. CONTRAST:  80 cc Isovue 370 COMPARISON:  Chest radiograph dated 12/15/2016 FINDINGS: Cardiovascular: There is moderate cardiomegaly with dilatation of the right atrium. There is retrograde flow of contrast from the right atrium into the IVC compatible with a degree of right cardiac dysfunction. Correlation with echocardiogram recommended. There is advanced coronary vascular calcification involving the left main, LAD, left circumflex artery, and to lesser degree RCA. There is no pericardial effusion. Left pectoral dual lead AICD device with leads in the right atrium and right ventricle. There is moderate atherosclerotic calcification of the thoracic aorta. There is no aneurysmal dilatation. Evaluation of the pulmonary arteries is  limited due to suboptimal opacification of the peripheral branches. There is however decreased opacification of the segmental branch point and subsegmental branches of the right lower lobe pulmonary artery (series 5 image 164- 195) most suspicious for pulmonary artery embolus. Mediastinum/Nodes: There is no hilar or mediastinal adenopathy. The esophagus is grossly unremarkable. No mediastinal fluid collection. No thyroid nodules identified. Lungs/Pleura: Mild centrilobular emphysema. There is diffuse chronic interstitial coarsening and hazy ground-glass density throughout the lungs. No interstitial edema. There is a focal area of consolidation in the left infrahilar region within the lingula concerning for pneumonia. A focal pulmonary infarct is less likely. There is a small right pleural effusion with associated mild compressive atelectasis of the right lower lobe. Pneumonia is less likely. There is no pneumothorax. The central airways are patent. Upper Abdomen: There is irregularity of the hepatic contour compatible with cirrhosis. Small perihepatic free fluid noted. The visualized upper abdomen is otherwise unremarkable. Musculoskeletal: Degenerative changes of the spine with multilevel osteophyte. No acute fracture. Median sternotomy wires noted and appear intact. Review of the MIP images confirms the above findings. IMPRESSION: Non opacification of the segmental branch point and subsegmental branches of the right lower lobe. Although there is suboptimal opacification of the distal pulmonary artery branches, findings are suspicious for PE. Focal left infrahilar/lingular consolidative change concerning for pneumonia. The pulmonary infarct is less likely. Clinical correlation is recommended. Moderate cardiomegaly with multivessel coronary vascular disease and evidence of right cardiac dysfunction. Correlation with echocardiogram recommended. Small right pleural effusion. Cirrhosis with partially visualized small  perihepatic ascites. These results were called by telephone at the time of interpretation on 12/15/2016 at 10:13 pm to Dr. Veryl Speak , who verbally acknowledged these results. Electronically Signed   By: Anner Crete M.D.   On: 12/15/2016 22:21   Ct Cervical Spine Wo Contrast  Result Date: 12/15/2016 CLINICAL DATA:  75 year old male with neck pain. EXAM: CT CERVICAL SPINE WITHOUT CONTRAST TECHNIQUE: Multidetector CT imaging of the cervical spine was performed without intravenous contrast. Multiplanar CT image reconstructions were also generated. COMPARISON:  None. FINDINGS: Alignment: Normal. Skull base and vertebrae: No acute fracture. No primary bone lesion or focal pathologic process. Soft tissues and spinal canal: No prevertebral fluid or swelling. No visible canal hematoma. Disc levels: There is degenerative changes. Disc osteophyte complex with moderate narrowing of the left neural foramina at C5-C6. The remainder of the neural foramina as well as the central canal appear widely patent. Upper chest: Negative. Cardiac pacemaker wires partially visualized in the left chest wall. Other: None IMPRESSION: No acute/ traumatic cervical spine pathology. Degenerative  changes with disc osteophyte complex and moderate narrowing of the left neural foramina at C5-C6. Electronically Signed   By: Anner Crete M.D.   On: 12/15/2016 21:58   Time Spent in minutes  25  Faye Ramsay M.D on 12/17/2016 at 11:58 AM  Between 7am to 7pm - Pager - [phone]  After 7pm go to [email or web address] - password [facility]  [facility] -  Office  [phone]

## 2016-12-17 NOTE — Progress Notes (Signed)
Tele notified that pt had converted into Afib, which was new this admission. Obtained VS and EKG. EKG confirmed Afib. Pt asymptomatic, denied chest pain, BP 99/69, HR in 90's, O2 95% on room air. Notified NP Lynch. Will continue to monitor pt for further decrease in bp and sustained tachycardia.

## 2016-12-18 LAB — BASIC METABOLIC PANEL
Anion gap: 8 (ref 5–15)
BUN: 25 mg/dL — AB (ref 6–20)
CHLORIDE: 98 mmol/L — AB (ref 101–111)
CO2: 27 mmol/L (ref 22–32)
CREATININE: 1.33 mg/dL — AB (ref 0.61–1.24)
Calcium: 8.6 mg/dL — ABNORMAL LOW (ref 8.9–10.3)
GFR calc Af Amer: 60 mL/min — ABNORMAL LOW (ref >60)
GFR calc non Af Amer: 51 mL/min — ABNORMAL LOW (ref >60)
GLUCOSE: 184 mg/dL — AB (ref 65–99)
POTASSIUM: 4.1 mmol/L (ref 3.5–5.1)
Sodium: 133 mmol/L — ABNORMAL LOW (ref 135–145)

## 2016-12-18 LAB — PROTIME-INR
INR: 2.78
PROTHROMBIN TIME: 29.9 s — AB (ref 11.4–15.2)

## 2016-12-18 LAB — MAGNESIUM: MAGNESIUM: 2.1 mg/dL (ref 1.7–2.4)

## 2016-12-18 LAB — CBC
HCT: 38.4 % — ABNORMAL LOW (ref 39.0–52.0)
Hemoglobin: 12.5 g/dL — ABNORMAL LOW (ref 13.0–17.0)
MCH: 27.7 pg (ref 26.0–34.0)
MCHC: 32.6 g/dL (ref 30.0–36.0)
MCV: 85 fL (ref 78.0–100.0)
PLATELETS: 116 10*3/uL — AB (ref 150–400)
RBC: 4.52 MIL/uL (ref 4.22–5.81)
RDW: 15.2 % (ref 11.5–15.5)
WBC: 8.4 10*3/uL (ref 4.0–10.5)

## 2016-12-18 LAB — GLUCOSE, CAPILLARY
GLUCOSE-CAPILLARY: 190 mg/dL — AB (ref 65–99)
Glucose-Capillary: 152 mg/dL — ABNORMAL HIGH (ref 65–99)
Glucose-Capillary: 130 mg/dL — ABNORMAL HIGH (ref 65–99)
Glucose-Capillary: 150 mg/dL — ABNORMAL HIGH (ref 65–99)

## 2016-12-18 MED ORDER — WARFARIN SODIUM 5 MG PO TABS
5.0000 mg | ORAL_TABLET | Freq: Once | ORAL | Status: AC
  Administered 2016-12-18: 5 mg via ORAL
  Filled 2016-12-18: qty 1

## 2016-12-18 MED ORDER — LEVOFLOXACIN 500 MG PO TABS
500.0000 mg | ORAL_TABLET | Freq: Every day | ORAL | Status: DC
  Administered 2016-12-18 – 2016-12-19 (×2): 500 mg via ORAL
  Filled 2016-12-18 (×2): qty 1

## 2016-12-18 NOTE — Plan of Care (Signed)
Problem: Safety: Goal: Ability to remain free from injury will improve Outcome: Completed/Met Date Met: 12/18/16 Patient uses call light appropriately and will call RN on the phone if he needs anything. Patient has all his personal belongings on bedside stand and within reach in his room and the call light and phone are on his bedside stand within reach.

## 2016-12-18 NOTE — Progress Notes (Addendum)
Report received via [place] in patient's room using SBAR format, reviewed orders, labs, tests, VS, meds and patient's general condition, will assume care of patient.  Patient was c/o "just not feeling well", VSS, Blood sugar 233 but he is having quite a few PVC's, palced him on O2 at 2L via N/V, will text page MD for further orders. Patient's Potassium level was 3.6 12/23 early AM and he received 160 mg po Lasix, will replace his potassium and get a Magnesium level in the AM, also will get an order for Zofran for c/o nausea.

## 2016-12-18 NOTE — Progress Notes (Signed)
PROGRESS NOTE                                                                                                                                                                                                             Patient Demographics:    Edward Meyer, is a 75 y.o. male, DOB - [birth year], [id number]  Admit date - 12/15/2016   Admitting Physician Vianne Bulls, MD  Outpatient Primary MD for the patient is Pcp Not In System  LOS - 2  Outpatient Specialists: Follows in [facility]  Chief Complaint  Patient presents with  . Shortness of Breath       Brief Narrative   75 year old male with history of coronary artery disease, chronic CHF, COPD on home O2 at night, insulin-dependent diabetes mellitus, hypertension and cirrhosis of liver presented to the ED with 3 day history of dyspnea with productive cough and upper back/left shoulder pain. He is visiting his daughter for the holidays from [place]. He arrived here 3 days prior to admission. During the overlying [place] he developed acute GI upset with nausea, vomiting and diarrhea, which have now resolved. However he developed increased shortness of breath and productive cough. Denies any fevers or chills, chest pain or palpitations. He developed sharp pain in his upper back and posterior shoulder worsened with deep inspiration and coughing. He denied any abdominal pain, bowel or urinary symptoms. Patient is on chronic warfarin for unclear reason. Patient informs having chronic leg swellings and pain in the left leg associated with ulcers. Patient informs being ambulatory to a very short distance and gets tired and short of breath easily.  In the ED Patient was afebrile, maintaining O2 sat on room air with stable other vitals. EKG showed PVCs. Chest x-ray unremarkable. Labs showed creatinine 1.27., Mild thrombocytopenia and elevated BNP of 454. Troponin was normal.  INR supratherapeutic at 4.54. CT of the cervical spine showed chronic degenerative changes without acute findings. CT angiogram of the chest was suggestive of possible pulmonary embolism in right lower lobe segmental and subsegmental branches as well as focal infrahilar and lingular consolidation concerning for pneumonia. Patient received 1 L normal saline bolus and 4 mg IV morphine and was observed on telemetry.   Subjective:   Patient seen and examined this morning. Informs his breathing and cough to be much better.  Assessment  & Plan :   Principal Problem:  ?Acute pulmonary embolism ([place]) - shown on CT angio on admission. Doppler of the bilateral lower extremities was negative for DVT. 2-D echo showed reduced EF but no right heart strain. - INR supratherapeutic on admission but now at target range - Dr. Clementeen Graham d/w radiologist, determined this is likely artifact  - pt still with exertional dyspnea and suspect this to be related to left lobar PNA  Acute on chronic respiratory failure with hypoxia due to Left lobar pneumonia ([place]), chronic systolic CHF - On empiric Rocephin, will change to Levaquin to see how pt does  - pt oxygen dependent at baseline 2 L  - Strep pneumonia antigen pending. Blood culture not sent on admission.  Chronic systolic CHF - Patient is from [place] so no results available to compare. - 2-D echo showed EF of 20-25% with diffuse hypokinesis, moderate MR suppressed RV systolic function and severely increased PA pressure of 68 mmHg, per daughter this is his baseline  - Appears euvolemic. Continue daily Lasix. Monitor I/O. Continue Imdur and statin.  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 chronic kidney disease, stage II - III - No baseline available - Cr stable ~ 1.2, no plan to increase the dose of Lasix - continue to hold Lisinopril  - BMP in AM  Venous Stasis with ulcers - Chronic. Appreciate wound care evaluation.  - Added enzymatic debridement ointment.  Supratherapeutic INR - On chronic warfarin. Dosing per pharmacy. - INR trending toward target range  - appreciate pharmacy input   Code Status : No code  Family Communication  : Discussed with daughter and wife over the phone   Disposition Plan  : Home tomorrow if breathing continues to improve  Barriers For Discharge : Improving symptoms  Consults  :  None  Procedures  :  CT angiogram of the chest CT of the cervical spine 2-D echo and Doppler lower extremity  DVT Prophylaxis  :  warfarin  Lab Results  Component Value Date   PLT 116 (L) 12/18/2016    Antibiotics  :    Anti-infectives    Start     Dose/Rate Route Frequency Ordered Stop   12/15/16 2330  cefTRIAXone (ROCEPHIN) 1 g in dextrose 5 % 50 mL IVPB     1 g 100 mL/hr over 30 Minutes Intravenous Daily at bedtime 12/15/16 2323 12/22/16 2159   12/15/16 2330  azithromycin (ZITHROMAX) tablet 500 mg  Status:  Discontinued     500 mg Oral Daily at bedtime 12/15/16 2323 12/16/16 0756        Objective:   Vitals:   12/17/16 0500 12/17/16 1305 12/17/16 1958 12/18/16 0514  BP:  119/75 (!) 92/57 (!) 102/54  Pulse:   82 80  Resp:   (!) 26 (!) 22  Temp:   97.8 F (36.6 C) 98.1 F (36.7 C)  TempSrc:   Axillary  Oral  SpO2:   100% 93%  Weight: 84.7 kg (186 lb 11.2 oz)   86.6 kg (191 lb)  Height:        Wt Readings from Last 3 Encounters:  12/18/16 86.6 kg (191 lb)      Intake/Output Summary (Last 24 hours) at 12/18/16 1141 Last data filed at 12/18/16 0400  Gross per 24 hour  Intake              930 ml  Output              300 ml  Net              630 ml   Physical Exam  Gen: Elderly male not in distress HEENT:  moist mucosa, supple neck Chest: diminished breath sounds at bases  CVS: Normal S1 and S2, no gallop GI: soft, NT, ND,  Musculoskeletal: warm, trace edema bilaterally, chronic venous stasis ulcer CNS: Alert and oriented   Data Review:    CBC  Recent Labs Lab 12/15/16 1816 12/16/16 0523 12/17/16 0836 12/18/16 0341  WBC 10.0 13.0* 9.1 8.4  HGB 15.2 14.0 13.2 12.5*  HCT 46.8 43.2 40.4 38.4*  PLT 139* 149* 142* 116*  MCV 85.9 85.7 85.1 85.0  MCH 27.9 27.8 27.8 27.7  MCHC 32.5 32.4 32.7 32.6  RDW 15.1 15.4 15.2 15.2  LYMPHSABS  --  2.6  --   --   MONOABS  --  1.8*  --   --   EOSABS  --  0.2  --   --   BASOSABS  --  0.1  --   --     Chemistries   Recent Labs Lab 12/15/16 1816 12/16/16 0523 12/17/16 0523 12/18/16 0341  NA 137 137 136 133*  K 4.1 4.5 3.6 4.1  CL 101 103 102 98*  CO2 25 26 27 27   GLUCOSE 136* 114* 72 184*  BUN 21* 23* 20 25*  CREATININE 1.27* 1.28* 1.27* 1.33*  CALCIUM 9.4 9.1 8.5* 8.6*  MG  --   --   --  2.1  AST  --   --  25  --   ALT  --   --  17  --   ALKPHOS  --   --  65  --   BILITOT  --   --  1.2  --    Coagulation profile  Recent Labs Lab 12/15/16 2031 12/16/16 0523 12/17/16 0836 12/18/16 0341  INR 4.54* 5.02* 3.82 2.78   ------------------------------------------------------------------------------------------------------------------    Component Value Date/Time   BNP 453.6 (H) 12/15/2016 1816    Inpatient Medications  Scheduled Meds: . carvedilol  6.25 mg Oral BID WC  . cefTRIAXone (ROCEPHIN)  IV  1 g Intravenous QHS  . collagenase   Topical Daily  . furosemide  160 mg Oral Daily  . insulin aspart  0-5 Units Subcutaneous QHS  . insulin aspart  0-9 Units  Subcutaneous TID WC  . insulin glargine  10 Units Subcutaneous QHS  . isosorbide mononitrate  20 mg Oral BID  . simvastatin  40 mg Oral q1800  . sodium chloride flush  3 mL Intravenous Q12H  . sotalol  80 mg Oral BID  . warfarin  5 mg Oral ONCE-1800  . Warfarin - Pharmacist Dosing Inpatient   Does not apply q1800   Continuous Infusions: PRN Meds:.sodium chloride, acetaminophen, HYDROcodone-acetaminophen, ipratropium-albuterol, ondansetron (ZOFRAN) IV, sodium chloride flush  Micro Results No results found for this or any previous visit (  from the past 240 hour(s)).  Radiology Reports Dg Chest 2 View  Result Date: 12/15/2016 CLINICAL DATA:  Productive cough with shortness of breath for 3 days. Pain in the neck, both shoulders and upper back. History of asthma, pacemaker and diabetes. EXAM: CHEST  2 VIEW COMPARISON:  None. FINDINGS: Left subclavian AICD leads are present within the right atrium and right ventricle. The heart size is normal status post median sternotomy. There is aortic atherosclerosis. The lungs appear clear. There is no pleural effusion or pneumothorax. EKG snap overlies the upper right chest. Mild thoracic spine degenerative changes are noted. IMPRESSION: No acute cardiopulmonary process or explanation for the patient's symptoms identified. Electronically Signed   By: Richardean Sale M.D.   On: 12/15/2016 18:45   Ct Angio Chest Pe W Or Wo Contrast  Result Date: 12/15/2016 CLINICAL DATA:  75 year old male with shortness of breath for several years. History of COPD, coronary artery disease, and CHF. EXAM: CT ANGIOGRAPHY CHEST WITH CONTRAST TECHNIQUE: Multidetector CT imaging of the chest was performed using the standard protocol during bolus administration of intravenous contrast. Multiplanar CT image reconstructions and MIPs were obtained to evaluate the vascular anatomy. CONTRAST:  80 cc Isovue 370 COMPARISON:  Chest radiograph dated 12/15/2016 FINDINGS: Cardiovascular: There  is moderate cardiomegaly with dilatation of the right atrium. There is retrograde flow of contrast from the right atrium into the IVC compatible with a degree of right cardiac dysfunction. Correlation with echocardiogram recommended. There is advanced coronary vascular calcification involving the left main, LAD, left circumflex artery, and to lesser degree RCA. There is no pericardial effusion. Left pectoral dual lead AICD device with leads in the right atrium and right ventricle. There is moderate atherosclerotic calcification of the thoracic aorta. There is no aneurysmal dilatation. Evaluation of the pulmonary arteries is limited due to suboptimal opacification of the peripheral branches. There is however decreased opacification of the segmental branch point and subsegmental branches of the right lower lobe pulmonary artery (series 5 image 164- 195) most suspicious for pulmonary artery embolus. Mediastinum/Nodes: There is no hilar or mediastinal adenopathy. The esophagus is grossly unremarkable. No mediastinal fluid collection. No thyroid nodules identified. Lungs/Pleura: Mild centrilobular emphysema. There is diffuse chronic interstitial coarsening and hazy ground-glass density throughout the lungs. No interstitial edema. There is a focal area of consolidation in the left infrahilar region within the lingula concerning for pneumonia. A focal pulmonary infarct is less likely. There is a small right pleural effusion with associated mild compressive atelectasis of the right lower lobe. Pneumonia is less likely. There is no pneumothorax. The central airways are patent. Upper Abdomen: There is irregularity of the hepatic contour compatible with cirrhosis. Small perihepatic free fluid noted. The visualized upper abdomen is otherwise unremarkable. Musculoskeletal: Degenerative changes of the spine with multilevel osteophyte. No acute fracture. Median sternotomy wires noted and appear intact. Review of the MIP images  confirms the above findings. IMPRESSION: Non opacification of the segmental branch point and subsegmental branches of the right lower lobe. Although there is suboptimal opacification of the distal pulmonary artery branches, findings are suspicious for PE. Focal left infrahilar/lingular consolidative change concerning for pneumonia. The pulmonary infarct is less likely. Clinical correlation is recommended. Moderate cardiomegaly with multivessel coronary vascular disease and evidence of right cardiac dysfunction. Correlation with echocardiogram recommended. Small right pleural effusion. Cirrhosis with partially visualized small perihepatic ascites. These results were called by telephone at the time of interpretation on 12/15/2016 at 10:13 pm to Dr. Veryl Speak , who verbally  acknowledged these results. Electronically Signed   By: Anner Crete M.D.   On: 12/15/2016 22:21   Ct Cervical Spine Wo Contrast  Result Date: 12/15/2016 CLINICAL DATA:  75 year old male with neck pain. EXAM: CT CERVICAL SPINE WITHOUT CONTRAST TECHNIQUE: Multidetector CT imaging of the cervical spine was performed without intravenous contrast. Multiplanar CT image reconstructions were also generated. COMPARISON:  None. FINDINGS: Alignment: Normal. Skull base and vertebrae: No acute fracture. No primary bone lesion or focal pathologic process. Soft tissues and spinal canal: No prevertebral fluid or swelling. No visible canal hematoma. Disc levels: There is degenerative changes. Disc osteophyte complex with moderate narrowing of the left neural foramina at C5-C6. The remainder of the neural foramina as well as the central canal appear widely patent. Upper chest: Negative. Cardiac pacemaker wires partially visualized in the left chest wall. Other: None IMPRESSION: No acute/ traumatic cervical spine pathology. Degenerative changes with disc osteophyte complex and moderate narrowing of the left neural foramina at C5-C6. Electronically Signed    By: Anner Crete M.D.   On: 12/15/2016 21:58   Time Spent in minutes  25  Faye Ramsay M.D on 12/18/2016 at 11:41 AM  Between 7am to 7pm - Pager - [phone]  After 7pm go to [email or web address] - password [facility]  [facility] -  Office  [phone]

## 2016-12-18 NOTE — Plan of Care (Signed)
Problem: Pain Managment: Goal: General experience of comfort will improve Outcome: Progressing Patient is still having some pain issues and is able to say where it is and how it feels, still taking Norco for pain as ordered. MD called for some Zofran IV for some nausea and stomach pain but all VSS and BS was stable, will continue to monitor.

## 2016-12-18 NOTE — Evaluation (Signed)
Physical Therapy Evaluation Patient Details Name: Edward Meyer MRN: [id number] DOB: 1941/06/18 Today's Date: 12/18/2016   History of Present Illness  75 year old male admitted with 3 day history of dyspnea with productive cough and upper back/left shoulder pain. Now diagnosed with diagnosis of acute pulmonary embolism. PMH: f coronary artery disease, chronic CHF, COPD, insulin-dependent diabetes mellitus, hypertension and cirrhosis of liver.  Clinical Impression  Pt presenting with decreased activity tolerance and stability compared to reported baseline PTA. Pt able to ambulate 150 ft with min guard assistance and rw during initial PT session. Normally the pt lives in [place] with his son but he will be staying with his daughter here in [place] upon D/C. Pt describes that he has some ongoing issues at home right now but did not specify. PT will continue to follow and progress the patient's mobility as tolerated in anticipation of D/C to his daughter's home.     Follow Up Recommendations No PT follow up;Supervision for mobility/OOB    Equipment Recommendations  Rolling walker with 5" wheels    Recommendations for Other Services       Precautions / Restrictions Precautions Precautions: Fall Precaution Comments: watch SpO2 Restrictions Weight Bearing Restrictions: No      Mobility  Bed Mobility Overal bed mobility: Needs Assistance Bed Mobility: Supine to Sit     Supine to sit: Supervision     General bed mobility comments: HOB elevated  Transfers Overall transfer level: Needs assistance Equipment used: Rolling walker (2 wheeled) Transfers: Sit to/from Stand Sit to Stand: Min guard         General transfer comment: good hand placement  Ambulation/Gait Ambulation/Gait assistance: Min guard Ambulation Distance (Feet): 150 Feet Assistive device: Rolling walker (2 wheeled) Gait Pattern/deviations: Step-through pattern;Decreased step length - right;Decreased step length -  left;Trunk flexed Gait velocity: decreased   General Gait Details: SpO2 96% prior to ambulation and 92% upon return. Pt does report feeling fatigued by end of session and admits that his is further than he usually walks.   Stairs            Wheelchair Mobility    Modified Rankin (Stroke Patients Only)       Balance Overall balance assessment: Needs assistance Sitting-balance support: No upper extremity supported Sitting balance-Leahy Scale: Fair     Standing balance support: Bilateral upper extremity supported Standing balance-Leahy Scale: Poor Standing balance comment: using rw                             Pertinent Vitals/Pain Pain Assessment: No/denies pain    Home Living Family/patient expects to be discharged to:: Private residence Living Arrangements: Children Available Help at Discharge: Family;Available 24 hours/day Type of Home: House Home Access: Stairs to enter Entrance Stairs-Rails: Psychiatric nurse of Steps: 1 Home Layout: Two level (reports working on plan with daughter) Home Equipment: None Additional Comments: Pt is staying at daughter's home in [place]. Will stay there until able to return to [place]. Pt states that there are some "family issues" going on right now.     Prior Function Level of Independence: Independent         Comments: Pt lives with his son in [place]. Pt reports being independent with his mobility and ADLs but son does cooking/cleaning/shopping. Pt admits to max ambulation of 100 ft PTA.      Hand Dominance        Extremity/Trunk Assessment   Upper Extremity  Assessment Upper Extremity Assessment: Overall WFL for tasks assessed    Lower Extremity Assessment Lower Extremity Assessment: Generalized weakness       Communication   Communication: No difficulties  Cognition Arousal/Alertness: Awake/alert Behavior During Therapy: Flat affect Overall Cognitive Status: Within Functional Limits  for tasks assessed                      General Comments      Exercises     Assessment/Plan    PT Assessment Patient needs continued PT services  PT Problem List Decreased strength;Decreased range of motion;Decreased activity tolerance;Decreased balance;Decreased mobility          PT Treatment Interventions DME instruction;Gait training;Stair training;Functional mobility training;Therapeutic activities;Therapeutic exercise;Patient/family education    PT Goals (Current goals can be found in the Care Plan section)  Acute Rehab PT Goals Patient Stated Goal: get strong enough to get back home PT Goal Formulation: With patient Time For Goal Achievement: 01/01/17 Potential to Achieve Goals: Good    Frequency Min 3X/week   Barriers to discharge        Co-evaluation               End of Session Equipment Utilized During Treatment: Gait belt Activity Tolerance: Patient limited by fatigue;Patient tolerated treatment well Patient left: in chair;with call bell/phone within reach;with chair alarm set Nurse Communication: Mobility status         Time: [id number] PT Time Calculation (min) (ACUTE ONLY): 24 min   Charges:   PT Evaluation $PT Eval Moderate Complexity: 1 Procedure PT Treatments $Gait Training: 8-22 mins   PT G Codes:        Cassell Clement, PT, CSCS Pager [phone] Office [phone]  12/18/2016, 1:35 PM

## 2016-12-18 NOTE — Progress Notes (Addendum)
Pharmacy Antibiotic Note  Edward Meyer is a 75 y.o. male admitted on 12/15/2016 with pneumonia.  Pharmacy has been consulted for levofloxacin dosing. Pt is Afeb, WBC down to 8.4, SCr ~1.3 with CrCl ~54. Of note - pt is on warfarin but INR is therapeutic today. Will need to follow INR closely with DDI.   Plan: Levofloxacin 500 mg PO q24h  D/c CTX Monitor clinical picture, DDI, Tmax, CBC  Height: 5\' 9"  (175.3 cm) Weight: 191 lb (86.6 kg) IBW/kg (Calculated) : 70.7  Temp (24hrs), Avg:98 F (36.7 C), Min:97.8 F (36.6 C), Max:98.1 F (36.7 C)   Recent Labs Lab 12/15/16 1816 12/16/16 0523 12/17/16 0523 12/17/16 0836 12/18/16 0341  WBC 10.0 13.0*  --  9.1 8.4  CREATININE 1.27* 1.28* 1.27*  --  1.33*    Estimated Creatinine Clearance: 53.9 mL/min (by C-G formula based on SCr of 1.33 mg/dL (H)).    No Known Allergies  Antimicrobials this admission:  CTX 12/22>>12/24  Azithro PO x 1 12/22 LQ 12/24 >> (12/28)  Dose adjustments this admission: none  Microbiology results: 12/22 HIV - neg  12/22 Strep pneumo Ag -  Thank you for allowing pharmacy to be a part of this patient's care.  Carlean Jews, Pharm.D. PGY1 Pharmacy Resident 12/24/201712:02 PM Pager [phone]

## 2016-12-18 NOTE — Progress Notes (Signed)
ANTICOAGULATION CONSULT NOTE - Follow Up Consult  Pharmacy Consult for Coumadin Indication: unclear initially (weak heart per pt) > now with new afib   No Known Allergies  Patient Measurements: Height: 5\' 9"  (175.3 cm) Weight: 191 lb (86.6 kg) IBW/kg (Calculated) : 70.7  Vital Signs: Temp: 98.1 F (36.7 C) (12/24 0514) Temp Source: Oral (12/24 0514) BP: 102/54 (12/24 0514) Pulse Rate: 80 (12/24 0514)  Labs:  Recent Labs  12/16/16 0523 12/17/16 0523 12/17/16 0836 12/18/16 0341  HGB 14.0  --  13.2 12.5*  HCT 43.2  --  40.4 38.4*  PLT 149*  --  142* 116*  LABPROT 48.0*  --  38.6* 29.9*  INR 5.02*  --  3.82 2.78  CREATININE 1.28* 1.27*  --  1.33*    Estimated Creatinine Clearance: 53.9 mL/min (by C-G formula based on SCr of 1.33 mg/dL (H)).  Assessment: 75 yr old [place] admitted with dyspnea and cough. On coumadin PTA for unknown reason. INR supratherapeutic on admit - 4.54 > 5.02 >3.83 > 2.78. CTA suggestive of PE, however, cannot r/o artifact. Duplex negative for DVT. New afib noted on monitor 12/23. Significant bleeding 12/22 from disconnected IV. INR therapeutic at 2.78 today and CBC stable. Platelets slightly decreased at 116. Spoke with Dr. Doyle Askew who is okay resuming Coumadin today. No overt s/s bleeding noted.   PTA warfarin: "7.5 mg on even days, 5 mg on odd days" - last 5mg  on 12/21 PTA  Goal of Therapy:  INR 2-3 Monitor platelets by anticoagulation protocol: Yes   Plan:   Coumadin 5mg  PO x1 today   Daily PT/INR  CBC as needed   Monitor for s/s bleeding  Argie Ramming, PharmD Pharmacy Resident  Pager [phone] 12/18/16 11:24 AM

## 2016-12-19 DIAGNOSIS — I5042 Chronic combined systolic (congestive) and diastolic (congestive) heart failure: Secondary | ICD-10-CM

## 2016-12-19 DIAGNOSIS — E119 Type 2 diabetes mellitus without complications: Secondary | ICD-10-CM

## 2016-12-19 DIAGNOSIS — Z794 Long term (current) use of insulin: Secondary | ICD-10-CM

## 2016-12-19 LAB — CBC
HEMATOCRIT: 39.7 % (ref 39.0–52.0)
HEMOGLOBIN: 13 g/dL (ref 13.0–17.0)
MCH: 28 pg (ref 26.0–34.0)
MCHC: 32.7 g/dL (ref 30.0–36.0)
MCV: 85.6 fL (ref 78.0–100.0)
Platelets: 123 10*3/uL — ABNORMAL LOW (ref 150–400)
RBC: 4.64 MIL/uL (ref 4.22–5.81)
RDW: 15.2 % (ref 11.5–15.5)
WBC: 6.5 10*3/uL (ref 4.0–10.5)

## 2016-12-19 LAB — PROTIME-INR
INR: 2.6
Prothrombin Time: 28.3 seconds — ABNORMAL HIGH (ref 11.4–15.2)

## 2016-12-19 LAB — BASIC METABOLIC PANEL
ANION GAP: 4 — AB (ref 5–15)
BUN: 24 mg/dL — ABNORMAL HIGH (ref 6–20)
CHLORIDE: 98 mmol/L — AB (ref 101–111)
CO2: 31 mmol/L (ref 22–32)
Calcium: 8.8 mg/dL — ABNORMAL LOW (ref 8.9–10.3)
Creatinine, Ser: 1.23 mg/dL (ref 0.61–1.24)
GFR calc non Af Amer: 56 mL/min — ABNORMAL LOW (ref >60)
Glucose, Bld: 160 mg/dL — ABNORMAL HIGH (ref 65–99)
POTASSIUM: 4.5 mmol/L (ref 3.5–5.1)
SODIUM: 133 mmol/L — AB (ref 135–145)

## 2016-12-19 LAB — PROCALCITONIN: Procalcitonin: 0.1 ng/mL

## 2016-12-19 LAB — GLUCOSE, CAPILLARY: GLUCOSE-CAPILLARY: 167 mg/dL — AB (ref 65–99)

## 2016-12-19 MED ORDER — WARFARIN SODIUM 5 MG PO TABS: 5.0000 mg | ORAL_TABLET | Freq: Once | ORAL | Status: DC

## 2016-12-19 MED ORDER — POLYETHYLENE GLYCOL 3350 17 G PO PACK
17.0000 g | PACK | Freq: Two times a day (BID) | ORAL | Status: DC
  Administered 2016-12-19: 17 g via ORAL
  Filled 2016-12-19: qty 1

## 2016-12-19 MED ORDER — LEVOFLOXACIN 500 MG PO TABS: 500.0000 mg | ORAL_TABLET | Freq: Every day | ORAL | 0 refills | Status: DC

## 2016-12-19 MED ORDER — HYDROCODONE-ACETAMINOPHEN 5-325 MG PO TABS: 1.0000 | ORAL_TABLET | ORAL | 0 refills | Status: AC | PRN

## 2016-12-19 MED ORDER — CARVEDILOL 3.125 MG PO TABS
3.1250 mg | ORAL_TABLET | Freq: Two times a day (BID) | ORAL | Status: DC
Start: 2016-12-19 — End: 2016-12-24

## 2016-12-19 MED ORDER — INSULIN GLARGINE 100 UNIT/ML ~~LOC~~ SOLN: 15.0000 [IU] | Freq: Every day | SUBCUTANEOUS | 11 refills | Status: AC

## 2016-12-19 MED ORDER — BISACODYL 10 MG RE SUPP: 10.0000 mg | Freq: Every day | RECTAL | Status: DC | PRN

## 2016-12-19 MED ORDER — ISOSORBIDE MONONITRATE 10 MG PO TABS: 10.0000 mg | ORAL_TABLET | Freq: Every day | ORAL | Status: AC

## 2016-12-19 MED ORDER — IPRATROPIUM-ALBUTEROL 0.5-2.5 (3) MG/3ML IN SOLN: 3.0000 mL | RESPIRATORY_TRACT | 1 refills | Status: AC | PRN

## 2016-12-19 NOTE — Discharge Instructions (Signed)
Community-Acquired Pneumonia, Adult °Introduction °Pneumonia is an infection of the lungs. One type of pneumonia can happen while a person is in a hospital. A different type can happen when a person is not in a hospital (community-acquired pneumonia). It is easy for this kind to spread from person to person. It can spread to you if you breathe near an infected person who coughs or sneezes. Some symptoms include: °· A dry cough. °· A wet (productive) cough. °· Fever. °· Sweating. °· Chest pain. °Follow these instructions at home: °· Take over-the-counter and prescription medicines only as told by your doctor. °¨ Only take cough medicine if you are losing sleep. °¨ If you were prescribed an antibiotic medicine, take it as told by your doctor. Do not stop taking the antibiotic even if you start to feel better. °· Sleep with your head and neck raised (elevated). You can do this by putting a few pillows under your head, or you can sleep in a recliner. °· Do not use tobacco products. These include cigarettes, chewing tobacco, and e-cigarettes. If you need help quitting, ask your doctor. °· Drink enough water to keep your pee (urine) clear or pale yellow. °A shot (vaccine) can help prevent pneumonia. Shots are often suggested for: °· People older than 73 years of age. °· People older than 73 years of age: °¨ Who are having cancer treatment. °¨ Who have long-term (chronic) lung disease. °¨ Who have problems with their body's defense system (immune system). °You may also prevent pneumonia if you take these actions: °· Get the flu (influenza) shot every year. °· Go to the dentist as often as told. °· Wash your hands often. If soap and water are not available, use hand sanitizer. °Contact a doctor if: °· You have a fever. °· You lose sleep because your cough medicine does not help. °Get help right away if: °· You are short of breath and it gets worse. °· You have more chest pain. °· Your sickness gets worse. This is very  serious if: °¨ You are an older adult. °¨ Your body's defense system is weak. °· You cough up blood. °This information is not intended to replace advice given to you by your health care provider. Make sure you discuss any questions you have with your health care provider. °Document Released: 05/30/2008 Document Revised: 05/19/2016 Document Reviewed: 04/08/2015 °© 2017 Elsevier ° °

## 2016-12-19 NOTE — Progress Notes (Signed)
ANTICOAGULATION CONSULT NOTE - Follow Up Consult  Pharmacy Consult for Coumadin Indication: unclear initially (weak heart per pt) > now with new afib   No Known Allergies  Patient Measurements: Height: 5\' 9"  (175.3 cm) Weight: 190 lb 3.2 oz (86.3 kg) IBW/kg (Calculated) : 70.7  Vital Signs: Temp: 97.5 F (36.4 C) (12/25 0409) Temp Source: Oral (12/25 0409) BP: 98/59 (12/25 0409) Pulse Rate: 68 (12/25 0409)  Labs:  Recent Labs  12/17/16 0523  12/17/16 0836 12/18/16 0341 12/19/16 0424  HGB  --   < > 13.2 12.5* 13.0  HCT  --   --  40.4 38.4* 39.7  PLT  --   --  142* 116* 123*  LABPROT  --   --  38.6* 29.9* 28.3*  INR  --   --  3.82 2.78 2.60  CREATININE 1.27*  --   --  1.33* 1.23  < > = values in this interval not displayed.  Estimated Creatinine Clearance: 58.2 mL/min (by C-G formula based on SCr of 1.23 mg/dL).  Assessment: 75 yr old male from out of town admitted with dyspnea and cough. On coumadin PTA for unknown reason. INR supratherapeutic on admit - 4.54 and was held. CTA suggestive of PE, however, cannot r/o artifact. Duplex negative for DVT.   New afib noted on monitor 12/23. INR therapeutic at 2.60 today and CBC stable. Platelets slightly decreased at 123. No overt s/s bleeding noted. Coumadin resumed (12/24) and okay per provider.   PTA warfarin: "7.5 mg on even days, 5 mg on odd days" - last 5mg  on 12/21 PTA  Goal of Therapy:  INR 2-3 Monitor platelets by anticoagulation protocol: Yes   Plan:   Coumadin 5mg  PO x1 today   Daily PT/INR  CBC as needed   Monitor for s/s bleeding  Argie Ramming, PharmD Pharmacy Resident  Pager [phone] 12/19/16 10:21 AM

## 2016-12-19 NOTE — Discharge Summary (Signed)
Physician Discharge Summary  Edward Meyer [id number] DOB: 12-10-1941 DOA: 12/15/2016  PCP: Pcp Not In System  Admit date: 12/15/2016 Discharge date: 12/19/2016  Recommendations for Outpatient Follow-up:  1. Pt will need to follow up with PCP in 1-2 weeks post discharge 2. Please obtain BMP to evaluate electrolytes and kidney function 3. Please also check CBC to evaluate Hg and Hct levels 4. Pt needs close PT/INR monitoring, asked [place] RN to have INR reported to me until pt leaves town next week  5. Please note that lisinopril was stopped due to soft BP and AKI 6. Dose of Imdur and Coreg lowered due to soft BP  Discharge Diagnoses:  Active Problems:   COPD (chronic obstructive pulmonary disease) (HCC)   Hypertension   Insulin-requiring or dependent type II diabetes mellitus (HCC)   CAD (coronary artery disease)   Cirrhosis of liver (HCC)   Kidney disease   Venous stasis ulcer (HCC)   Chronic CHF (congestive heart failure) ([place])   Supratherapeutic INR  Discharge Condition: Stable  Diet recommendation: Heart healthy diet discussed in details   Brief Narrative   75 year old male with history of coronary artery disease, chronic CHF, COPD on home O2 at night, insulin-dependent diabetes mellitus, hypertension and cirrhosis of liver presented to the ED with 3 day history of dyspnea with productive cough and upper back/left shoulder pain. He is visiting his daughter for the holidays from [place]. He arrived here 3 days prior to admission. During the overlying [place] he developed acute GI upset with nausea, vomiting and diarrhea, which have now resolved. However he developed increased shortness of breath and productive cough. Denies any fevers or chills, chest pain or palpitations. He developed sharp pain in his upper back and posterior shoulder worsened with deep inspiration and coughing. He denied any abdominal pain, bowel or urinary symptoms. Patient is on chronic warfarin for  unclear reason. Patient informs having chronic leg swellings and pain in the left leg associated with ulcers. Patient informs being ambulatory to a very short distance and gets tired and short of breath easily.  In the ED Patient was afebrile, maintaining O2 sat on room air with stable other vitals. EKG showed PVCs. Chest x-ray unremarkable. Labs showed creatinine 1.27., Mild thrombocytopenia and elevated BNP of 454. Troponin was normal. INR supratherapeutic at 4.54. CT of the cervical spine showed chronic degenerative changes without acute findings. CT angiogram of the chest was suggestive of possible pulmonary embolism in right lower lobe segmental and subsegmental branches as well as focal infrahilar and lingular consolidation concerning for pneumonia. Patient received 1 L normal saline bolus and 4 mg IV morphine and was observed on telemetry.   Subjective:   Patient seen and examined this morning. Informs his breathing and cough to be much better.   Assessment  & Plan :   Principal Problem:  ?Acute pulmonary embolism ([place]) - shown on CT angio on admission. Doppler of the bilateral lower extremities was negative for DVT. 2-D echo showed reduced EF but no right heart strain. - INR supratherapeutic on admission but now at target range - Dr. Clementeen Graham d/w radiologist, determined this is likely artifact  - pt still with exertional dyspnea related to left lobar PNA  Acute on chronic respiratory failure with hypoxia due to Left lobar pneumonia ([place]), chronic systolic CHF - On empiric Rocephin, changed to Levaquin to complete therapy  - pt oxygen dependent at baseline 2 L [place] - Strep pneumonia antigen negative. Blood culture not sent on admission.  Chronic systolic CHF - Patient is from [place] so no results available to compare. 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 chronic kidney disease, stage II - III - No baseline available - Cr stable and WNL this AM  Venous Stasis with ulcers - Chronic. Appreciate wound care evaluation.  - Added enzymatic debridement ointment.  Supratherapeutic INR - On chronic warfarin. Dosing per pharmacy. - INR trending toward target range  - appreciate pharmacy input   Code Status : No code  Family Communication  : Discussed with daughter and wife over the phone   Disposition Plan  : Home   Procedures  :  CT angiogram of the chest CT of the cervical spine 2-D echo and Doppler lower extremity  DVT Prophylaxis  :  warfarin  Procedures/Studies: Dg Chest 2 View  Result Date: 12/15/2016 CLINICAL DATA:  Productive cough with shortness of breath for 3 days. Pain in the neck, both shoulders and upper back. History of asthma, pacemaker and diabetes. EXAM: CHEST  2 VIEW COMPARISON:  None. FINDINGS: Left subclavian AICD leads are present within the right atrium and right ventricle. The heart size is normal status post median sternotomy. There is aortic atherosclerosis. The lungs appear clear. There is no pleural effusion or pneumothorax. EKG snap overlies the upper right chest. Mild thoracic spine degenerative changes are noted. IMPRESSION: No acute cardiopulmonary process or explanation for the patient's symptoms identified. Electronically Signed   By: Richardean Sale  M.D.   On: 12/15/2016 18:45   Ct Angio Chest Pe W Or Wo Contrast  Result Date: 12/15/2016 CLINICAL DATA:  75 year old male with shortness of breath for several years. History of COPD, coronary artery disease, and CHF. EXAM: CT ANGIOGRAPHY CHEST WITH CONTRAST TECHNIQUE: Multidetector CT imaging of the chest was performed using the standard protocol during bolus administration of intravenous contrast. Multiplanar CT image reconstructions and MIPs were obtained to evaluate the vascular anatomy. CONTRAST:  80 cc Isovue 370 COMPARISON:  Chest radiograph dated 12/15/2016 FINDINGS: Cardiovascular: There is moderate cardiomegaly with dilatation of the right atrium. There is retrograde flow of contrast from the right atrium into the IVC compatible with a degree of right cardiac dysfunction. Correlation with echocardiogram recommended. There is advanced coronary vascular calcification involving the left main, LAD, left circumflex artery, and to lesser degree RCA. There is no pericardial effusion. Left pectoral dual lead AICD device with leads in the right atrium and right ventricle. There is moderate atherosclerotic calcification of the thoracic aorta. There is no aneurysmal dilatation. Evaluation of the pulmonary arteries is limited due to suboptimal opacification of the peripheral branches. There is however decreased opacification of the segmental branch point and subsegmental branches of the right lower lobe pulmonary artery (series 5 image 164- 195) most suspicious for pulmonary artery embolus. Mediastinum/Nodes: There is no hilar or  mediastinal adenopathy. The esophagus is grossly unremarkable. No mediastinal fluid collection. No thyroid nodules identified. Lungs/Pleura: Mild centrilobular emphysema. There is diffuse chronic interstitial coarsening and hazy ground-glass density throughout the lungs. No interstitial edema. There is a focal area of consolidation in the left infrahilar region within the lingula  concerning for pneumonia. A focal pulmonary infarct is less likely. There is a small right pleural effusion with associated mild compressive atelectasis of the right lower lobe. Pneumonia is less likely. There is no pneumothorax. The central airways are patent. Upper Abdomen: There is irregularity of the hepatic contour compatible with cirrhosis. Small perihepatic free fluid noted. The visualized upper abdomen is otherwise unremarkable. Musculoskeletal: Degenerative changes of the spine with multilevel osteophyte. No acute fracture. Median sternotomy wires noted and appear intact. Review of the MIP images confirms the above findings. IMPRESSION: Non opacification of the segmental branch point and subsegmental branches of the right lower lobe. Although there is suboptimal opacification of the distal pulmonary artery branches, findings are suspicious for PE. Focal left infrahilar/lingular consolidative change concerning for pneumonia. The pulmonary infarct is less likely. Clinical correlation is recommended. Moderate cardiomegaly with multivessel coronary vascular disease and evidence of right cardiac dysfunction. Correlation with echocardiogram recommended. Small right pleural effusion. Cirrhosis with partially visualized small perihepatic ascites. These results were called by telephone at the time of interpretation on 12/15/2016 at 10:13 pm to Dr. Veryl Speak , who verbally acknowledged these results. Electronically Signed   By: Anner Crete M.D.   On: 12/15/2016 22:21   Ct Cervical Spine Wo Contrast  Result Date: 12/15/2016 CLINICAL DATA:  75 year old male with neck pain. EXAM: CT CERVICAL SPINE WITHOUT CONTRAST TECHNIQUE: Multidetector CT imaging of the cervical spine was performed without intravenous contrast. Multiplanar CT image reconstructions were also generated. COMPARISON:  None. FINDINGS: Alignment: Normal. Skull base and vertebrae: No acute fracture. No primary bone lesion or focal pathologic  process. Soft tissues and spinal canal: No prevertebral fluid or swelling. No visible canal hematoma. Disc levels: There is degenerative changes. Disc osteophyte complex with moderate narrowing of the left neural foramina at C5-C6. The remainder of the neural foramina as well as the central canal appear widely patent. Upper chest: Negative. Cardiac pacemaker wires partially visualized in the left chest wall. Other: None IMPRESSION: No acute/ traumatic cervical spine pathology. Degenerative changes with disc osteophyte complex and moderate narrowing of the left neural foramina at C5-C6. Electronically Signed   By: Anner Crete M.D.   On: 12/15/2016 21:58     Discharge Exam: Vitals:   12/18/16 2025 12/19/16 0409  BP: (!) 95/56 (!) 98/59  Pulse: 71 68  Resp: 20 (!) 21  Temp: 97.5 F (36.4 C) 97.5 F (36.4 C)   Vitals:   12/18/16 0514 12/18/16 1400 12/18/16 2025 12/19/16 0409  BP: (!) 102/54 119/69 (!) 95/56 (!) 98/59  Pulse: 80 75 71 68  Resp: (!) 22 18 20  (!) 21  Temp: 98.1 F (36.7 C) 98.2 F (36.8 C) 97.5 F (36.4 C) 97.5 F (36.4 C)  TempSrc: Oral Oral Oral Oral  SpO2: 93% 100% 96% 96%  Weight: 86.6 kg (191 lb)   86.3 kg (190 lb 3.2 oz)  Height:        General: Pt is alert, follows commands appropriately, not in acute distress Cardiovascular: Regular rate and rhythm, S1/S2 +, no rubs, no gallops Respiratory: Clear to auscultation bilaterally, no wheezing, no crackles, no rhonchi Abdominal: Soft, non tender, non distended, bowel sounds +, no guarding  Discharge Instructions  Discharge Instructions    Diet - low sodium heart healthy    Complete by:  As directed    Increase activity slowly    Complete by:  As directed      Allergies as of 12/19/2016   No Known Allergies     Medication List    STOP taking these medications   lisinopril 5 MG tablet Commonly known as:  PRINIVIL,ZESTRIL     TAKE these medications   carvedilol 3.125 MG tablet Commonly known as:   COREG Take 1 tablet (3.125 mg total) by mouth 2 (two) times daily with a meal. What changed:  medication strength  how much to take   furosemide 40 MG tablet Commonly known as:  LASIX Take 160 mg by mouth daily.   HYDROcodone-acetaminophen 5-325 MG tablet Commonly known as:  NORCO/VICODIN Take 1-2 tablets by mouth every 4 (four) hours as needed for moderate pain or severe pain.   insulin glargine 100 UNIT/ML injection Commonly known as:  LANTUS Inject 0.15 mLs (15 Units total) into the skin at bedtime. What changed:  how much to take   ipratropium-albuterol 0.5-2.5 (3) MG/3ML Soln Commonly known as:  DUONEB Take 3 mLs by nebulization every 4 (four) hours as needed.   isosorbide mononitrate 10 MG tablet Commonly known as:  ISMO,MONOKET Take 1 tablet (10 mg total) by mouth daily at 8 pm. What changed:  medication strength  how much to take  when to take this   levofloxacin 500 MG tablet Commonly known as:  LEVAQUIN Take 1 tablet (500 mg total) by mouth daily. Start taking on:  12/20/2016   OXYGEN Inhale 2 L into the lungs at bedtime as needed (for SOB).   potassium chloride SA 20 MEQ tablet Commonly known as:  K-DUR,KLOR-CON Take 20 mEq by mouth daily.   ROLAIDS 550-110 MG Chew Generic drug:  Ca Carbonate-Mag Hydroxide Chew 1 each by mouth daily as needed (heartburn).   simvastatin 40 MG tablet Commonly known as:  ZOCOR Take 40 mg by mouth daily at 6 PM.   sotalol 80 MG tablet Commonly known as:  BETAPACE Take 80 mg by mouth 2 (two) times daily.   warfarin 5 MG tablet Commonly known as:  COUMADIN Take 5-7.5 mg by mouth See admin instructions. Takes 5mg  on odd days  Takes 7.5mg  on even days       The results of significant diagnostics from this hospitalization (including imaging, microbiology, ancillary and laboratory) are listed below for reference.     Microbiology: No results found for this or any previous visit (from the past 240 hour(s)).    Labs: Basic Metabolic Panel:  Recent Labs Lab 12/15/16 1816 12/16/16 0523 12/17/16 0523 12/18/16 0341 12/19/16 0424  NA 137 137 136 133* 133*  K 4.1 4.5 3.6 4.1 4.5  CL 101 103 102 98* 98*  CO2 25 26 27 27 31   GLUCOSE 136* 114* 72 184* 160*  BUN 21* 23* 20 25* 24*  CREATININE 1.27* 1.28* 1.27* 1.33* 1.23  CALCIUM 9.4 9.1 8.5* 8.6* 8.8*  MG  --   --   --  2.1  --    Liver Function Tests:  Recent Labs Lab 12/17/16 0523  AST 25  ALT 17  ALKPHOS 65  BILITOT 1.2  PROT 5.8*  ALBUMIN 3.0*   CBC:  Recent Labs Lab 12/15/16 1816 12/16/16 0523 12/17/16 0836 12/18/16 0341 12/19/16 0424  WBC 10.0 13.0* 9.1 8.4 6.5  NEUTROABS  --  8.4*  --   --   --  HGB 15.2 14.0 13.2 12.5* 13.0  HCT 46.8 43.2 40.4 38.4* 39.7  MCV 85.9 85.7 85.1 85.0 85.6  PLT 139* 149* 142* 116* 123*   BNP: BNP (last 3 results)  Recent Labs  12/15/16 1816  BNP 453.6*   CBG:  Recent Labs Lab 12/18/16 0731 12/18/16 1108 12/18/16 1709 12/18/16 2141 12/19/16 0820  GLUCAP 152* 130* 150* 190* 167*   SIGNED: Time coordinating discharge: 30 minutes  MAGICK-MYERS, ISKRA, MD  [facility] 12/19/2016, 10:22 AM Pager [phone]  If 7PM-7AM, please contact night-coverage [email or web address] Password [email or web address]

## 2016-12-19 NOTE — Care Management Note (Signed)
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  CM called the Pt's room and spoke with his daughter Anzil - which the pt requested, discussed [place] agencies, choice offered, no preference noted.  CM made referral to [facility] and they were going to deliver the RW to the Pt's room and then call the Pt to setup time for the [facility] visits.  Questions answered.  [place], [place], [place] 12/19/2016, 4:18 PM

## 2016-12-21 ENCOUNTER — Inpatient Hospital Stay (HOSPITAL_COMMUNITY)
Admission: EM | Admit: 2016-12-21 | Discharge: 2016-12-24 | DRG: 175 | Disposition: A | Discharge status: Home / Self Care | Payer: Medicare Other | Attending: Internal Medicine | Admitting: Internal Medicine

## 2016-12-21 ENCOUNTER — Encounter (HOSPITAL_COMMUNITY): Payer: Self-pay | Admitting: Emergency Medicine

## 2016-12-21 ENCOUNTER — Emergency Department (HOSPITAL_COMMUNITY): Payer: Medicare Other

## 2016-12-21 DIAGNOSIS — Z79899 Other long term (current) drug therapy: Secondary | ICD-10-CM

## 2016-12-21 DIAGNOSIS — Z87891 Personal history of nicotine dependence: Secondary | ICD-10-CM

## 2016-12-21 DIAGNOSIS — I255 Ischemic cardiomyopathy: Secondary | ICD-10-CM | POA: Diagnosis present

## 2016-12-21 DIAGNOSIS — I110 Hypertensive heart disease with heart failure: Secondary | ICD-10-CM | POA: Diagnosis present

## 2016-12-21 DIAGNOSIS — Z9981 Dependence on supplemental oxygen: Secondary | ICD-10-CM

## 2016-12-21 DIAGNOSIS — E876 Hypokalemia: Secondary | ICD-10-CM | POA: Diagnosis present

## 2016-12-21 DIAGNOSIS — J9610 Chronic respiratory failure, unspecified whether with hypoxia or hypercapnia: Secondary | ICD-10-CM | POA: Diagnosis present

## 2016-12-21 DIAGNOSIS — I2699 Other pulmonary embolism without acute cor pulmonale: Principal | ICD-10-CM | POA: Diagnosis present

## 2016-12-21 DIAGNOSIS — I509 Heart failure, unspecified: Secondary | ICD-10-CM

## 2016-12-21 DIAGNOSIS — R0602 Shortness of breath: Secondary | ICD-10-CM | POA: Diagnosis not present

## 2016-12-21 DIAGNOSIS — Z8572 Personal history of non-Hodgkin lymphomas: Secondary | ICD-10-CM

## 2016-12-21 DIAGNOSIS — I472 Ventricular tachycardia: Secondary | ICD-10-CM | POA: Diagnosis present

## 2016-12-21 DIAGNOSIS — D696 Thrombocytopenia, unspecified: Secondary | ICD-10-CM | POA: Diagnosis present

## 2016-12-21 DIAGNOSIS — Z7901 Long term (current) use of anticoagulants: Secondary | ICD-10-CM

## 2016-12-21 DIAGNOSIS — Z95810 Presence of automatic (implantable) cardiac defibrillator: Secondary | ICD-10-CM

## 2016-12-21 DIAGNOSIS — J440 Chronic obstructive pulmonary disease with acute lower respiratory infection: Secondary | ICD-10-CM | POA: Diagnosis present

## 2016-12-21 DIAGNOSIS — L97929 Non-pressure chronic ulcer of unspecified part of left lower leg with unspecified severity: Secondary | ICD-10-CM | POA: Diagnosis present

## 2016-12-21 DIAGNOSIS — Z794 Long term (current) use of insulin: Secondary | ICD-10-CM

## 2016-12-21 DIAGNOSIS — Z951 Presence of aortocoronary bypass graft: Secondary | ICD-10-CM

## 2016-12-21 DIAGNOSIS — E119 Type 2 diabetes mellitus without complications: Secondary | ICD-10-CM

## 2016-12-21 DIAGNOSIS — I25118 Atherosclerotic heart disease of native coronary artery with other forms of angina pectoris: Secondary | ICD-10-CM | POA: Diagnosis present

## 2016-12-21 DIAGNOSIS — I5042 Chronic combined systolic (congestive) and diastolic (congestive) heart failure: Secondary | ICD-10-CM

## 2016-12-21 DIAGNOSIS — I480 Paroxysmal atrial fibrillation: Secondary | ICD-10-CM | POA: Diagnosis present

## 2016-12-21 DIAGNOSIS — Z8612 Personal history of poliomyelitis: Secondary | ICD-10-CM

## 2016-12-21 DIAGNOSIS — E1165 Type 2 diabetes mellitus with hyperglycemia: Secondary | ICD-10-CM | POA: Diagnosis present

## 2016-12-21 DIAGNOSIS — K7460 Unspecified cirrhosis of liver: Secondary | ICD-10-CM | POA: Diagnosis present

## 2016-12-21 DIAGNOSIS — I10 Essential (primary) hypertension: Secondary | ICD-10-CM | POA: Diagnosis present

## 2016-12-21 DIAGNOSIS — I83029 Varicose veins of left lower extremity with ulcer of unspecified site: Secondary | ICD-10-CM | POA: Diagnosis present

## 2016-12-21 DIAGNOSIS — J449 Chronic obstructive pulmonary disease, unspecified: Secondary | ICD-10-CM | POA: Diagnosis present

## 2016-12-21 DIAGNOSIS — J189 Pneumonia, unspecified organism: Secondary | ICD-10-CM | POA: Diagnosis present

## 2016-12-21 DIAGNOSIS — E785 Hyperlipidemia, unspecified: Secondary | ICD-10-CM | POA: Diagnosis present

## 2016-12-21 LAB — BASIC METABOLIC PANEL
ANION GAP: 11 (ref 5–15)
BUN: 28 mg/dL — ABNORMAL HIGH (ref 6–20)
CHLORIDE: 99 mmol/L — AB (ref 101–111)
CO2: 24 mmol/L (ref 22–32)
CREATININE: 1.24 mg/dL (ref 0.61–1.24)
Calcium: 9.4 mg/dL (ref 8.9–10.3)
GFR calc non Af Amer: 56 mL/min — ABNORMAL LOW (ref >60)
Glucose, Bld: 223 mg/dL — ABNORMAL HIGH (ref 65–99)
POTASSIUM: 4.5 mmol/L (ref 3.5–5.1)
Sodium: 134 mmol/L — ABNORMAL LOW (ref 135–145)

## 2016-12-21 LAB — BRAIN NATRIURETIC PEPTIDE: B Natriuretic Peptide: 724.3 pg/mL — ABNORMAL HIGH (ref 0.0–100.0)

## 2016-12-21 LAB — CBC
HEMATOCRIT: 42.9 % (ref 39.0–52.0)
HEMOGLOBIN: 14 g/dL (ref 13.0–17.0)
MCH: 27.7 pg (ref 26.0–34.0)
MCHC: 32.6 g/dL (ref 30.0–36.0)
MCV: 85 fL (ref 78.0–100.0)
Platelets: 152 10*3/uL (ref 150–400)
RBC: 5.05 MIL/uL (ref 4.22–5.81)
RDW: 15.4 % (ref 11.5–15.5)
WBC: 8.4 10*3/uL (ref 4.0–10.5)

## 2016-12-21 LAB — I-STAT TROPONIN, ED: Troponin i, poc: 0.01 ng/mL (ref 0.00–0.08)

## 2016-12-21 LAB — PROTIME-INR
INR: 3.31
PROTHROMBIN TIME: 34.4 s — AB (ref 11.4–15.2)

## 2016-12-21 NOTE — ED Provider Notes (Signed)
[facility] DEPT Provider Note   CSN: [id number] Arrival date & time: 12/21/16  1905  By signing my name below, I, Jeanell Sparrow, attest that this documentation has been prepared under the direction and in the presence of Orpah Greek, MD . Electronically Signed: Jeanell Sparrow, Scribe. 12/21/2016. 11:05 PM.  History   Chief Complaint Chief Complaint  Patient presents with  . Shortness of Breath  . Anxiety   The history is provided by the patient and a relative. No language interpreter was used.   HPI Comments: Edward Meyer is a 75 y.o. male with a PMHx of COPD and CHF who presents to the Emergency Department complaining of intermittent heart palpitations that started about a week ago. He reports he is currently traveling and visiting relatives. He states he has sudden episodes of anxiety and increased heart rate while laying down lately. He reports associated sleep disturbance. He reports he is usually on oxygen at home (only at night) and uses an albuterol inhaler as needed. He admits to being recently treated for pneumonia with current residual symptoms. He reports no modifying factors. He denies any fever or other complaints.    Past Medical History:  Diagnosis Date  . Cancer ([place])   . CHF (congestive heart failure) ([place])   . COPD (chronic obstructive pulmonary disease) ([place])   . Coronary artery disease   . Diabetes mellitus without complication ([place])   . Hyperlipidemia   . Hypertension     Patient Active Problem List   Diagnosis Date Noted  . COPD (chronic obstructive pulmonary disease) ([place]) 12/15/2016  . Acute pulmonary embolism ([place]) 12/15/2016  . Hypertension 12/15/2016  . Insulin-requiring or dependent type II diabetes mellitus ([place]) 12/15/2016  . CAD (coronary artery disease) 12/15/2016  . Cirrhosis of liver ([place]) 12/15/2016  . Kidney disease 12/15/2016  . Venous stasis ulcer ([place]) 12/15/2016  . Chronic CHF (congestive heart failure) ([place]) 12/15/2016  .  Supratherapeutic INR 12/15/2016  . Neck pain 12/15/2016  . Thrombocytopenia ([place]) 12/15/2016  . CAP (community acquired pneumonia) 12/15/2016    History reviewed. No pertinent surgical history.     Home Medications    Prior to Admission medications   Medication Sig Start Date End Date Taking? Authorizing Provider  Ca Carbonate-Mag Hydroxide (ROLAIDS) 550-110 MG CHEW Chew 1 each by mouth daily as needed (heartburn).   Yes Historical Provider, MD  carvedilol (COREG) 3.125 MG tablet Take 1 tablet (3.125 mg total) by mouth 2 (two) times daily with a meal. 12/19/16  Yes Theodis Blaze, MD  furosemide (LASIX) 40 MG tablet Take 160 mg by mouth daily.   Yes Historical Provider, MD  HYDROcodone-acetaminophen (NORCO/VICODIN) 5-325 MG tablet Take 1-2 tablets by mouth every 4 (four) hours as needed for moderate pain or severe pain. 12/19/16  Yes Theodis Blaze, MD  insulin glargine (LANTUS) 100 UNIT/ML injection Inject 0.15 mLs (15 Units total) into the skin at bedtime. 12/19/16  Yes Theodis Blaze, MD  ipratropium-albuterol (DUONEB) 0.5-2.5 (3) MG/3ML SOLN Take 3 mLs by nebulization every 4 (four) hours as needed. 12/19/16  Yes Theodis Blaze, MD  isosorbide mononitrate (ISMO,MONOKET) 10 MG tablet Take 1 tablet (10 mg total) by mouth daily at 8 pm. 12/19/16  Yes Theodis Blaze, MD  levofloxacin (LEVAQUIN) 500 MG tablet Take 1 tablet (500 mg total) by mouth daily. 12/20/16  Yes Theodis Blaze, MD  potassium chloride SA (K-DUR,KLOR-CON) 20 MEQ tablet Take 20 mEq by mouth daily.   Yes Historical Provider,  MD  simvastatin (ZOCOR) 40 MG tablet Take 40 mg by mouth daily at 6 PM.   Yes Historical Provider, MD  sotalol (BETAPACE) 80 MG tablet Take 80 mg by mouth 2 (two) times daily.   Yes Historical Provider, MD  warfarin (COUMADIN) 5 MG tablet Take 5-7.5 mg by mouth See admin instructions. Alternate taking 5 mg on one day then take 7.5 mg the next day   Yes Historical Provider, MD  OXYGEN Inhale 2 L into the  lungs at bedtime as needed (for SOB).     Historical Provider, MD    Family History Family History  Problem Relation Age of Onset  . Obesity Daughter     Social History Social History  Substance Use Topics  . Smoking status: Former Research scientist (life sciences)  . Smokeless tobacco: Never Used  . Alcohol use No     Allergies   Patient has no known allergies.   Review of Systems Review of Systems  Constitutional: Negative for fever.  Cardiovascular: Positive for palpitations.  Psychiatric/Behavioral: Positive for sleep disturbance. The patient is nervous/anxious.   All other systems reviewed and are negative.    Physical Exam Updated Vital Signs BP 104/60   Pulse 69   Temp 97.5 F (36.4 C) (Oral)   Resp (!) 7   Ht 5\' 9"  (1.753 m)   Wt 195 lb (88.5 kg)   SpO2 100%   BMI 28.80 kg/m   Physical Exam  Constitutional: He is oriented to person, place, and time. He appears well-developed and well-nourished. No distress.  HENT:  Head: Normocephalic and atraumatic.  Right Ear: Hearing normal.  Left Ear: Hearing normal.  Nose: Nose normal.  Mouth/Throat: Oropharynx is clear and moist and mucous membranes are normal.  Eyes: Conjunctivae and EOM are normal. Pupils are equal, round, and reactive to light.  Neck: Normal range of motion. Neck supple.  Cardiovascular: Regular rhythm, S1 normal and S2 normal.  Exam reveals no gallop and no friction rub.   No murmur heard. Pulmonary/Chest: Effort normal and breath sounds normal. No respiratory distress. He exhibits no tenderness.  Abdominal: Soft. Normal appearance and bowel sounds are normal. There is no hepatosplenomegaly. There is no tenderness. There is no rebound, no guarding, no tenderness at McBurney's point and negative Murphy's sign. No hernia.  Musculoskeletal: Normal range of motion. He exhibits edema.  +1 pitting edema in BLE.   Neurological: He is alert and oriented to person, place, and time. He has normal strength. No cranial nerve  deficit or sensory deficit. Coordination normal. GCS eye subscore is 4. GCS verbal subscore is 5. GCS motor subscore is 6.  Skin: Skin is warm, dry and intact. No rash noted. No cyanosis.  Psychiatric: He has a normal mood and affect. His speech is normal and behavior is normal. Thought content normal.  Nursing note and vitals reviewed.    ED Treatments / Results  DIAGNOSTIC STUDIES: Oxygen Saturation is 100% on RA, normal by my interpretation.    COORDINATION OF CARE: 11:10 PM- Pt advised of plan for treatment and pt agrees.  Labs (all labs ordered are listed, but only abnormal results are displayed) Labs Reviewed  BASIC METABOLIC PANEL - Abnormal; Notable for the following:       Result Value   Sodium 134 (*)    Chloride 99 (*)    Glucose, Bld 223 (*)    BUN 28 (*)    GFR calc non Af Amer 56 (*)    All other components within  normal limits  BRAIN NATRIURETIC PEPTIDE - Abnormal; Notable for the following:    B Natriuretic Peptide 724.3 (*)    All other components within normal limits  PROTIME-INR - Abnormal; Notable for the following:    Prothrombin Time 34.4 (*)    All other components within normal limits  CBC  I-STAT TROPOININ, ED    EKG  EKG Interpretation  Date/Time:  Wednesday December 21 2016 19:47:17 EST Ventricular Rate:  72 PR Interval:  162 QRS Duration: 92 QT Interval:  456 QTC Calculation: 499 R Axis:   45 Text Interpretation:  Normal sinus rhythm Low voltage QRS Nonspecific T wave abnormality Prolonged QT Abnormal ECG No significant change since last tracing Confirmed by POLLINA  MD, CHRISTOPHER [phone]) on 12/21/2016 11:15:06 PM       Radiology Dg Chest 2 View  Result Date: 12/21/2016 CLINICAL DATA:  Shortness of breath and cough for 1 week. Recent hospitalization and discharge without reported improvement symptoms. EXAM: CHEST  2 VIEW COMPARISON:  Radiographs and CT 12/15/2016 FINDINGS: Patient is post median sternotomy. Dual lead left-sided  pacemaker remains in place. The heart size and mediastinal contours are unchanged with mild cardiomegaly. Coronary stent versus calcifications. No pulmonary edema. Small right pleural effusion appears similar to prior exams. Lingular opacity on chest CT not well visualized radiographically. No pneumothorax. Stable degenerative change in the spine. IMPRESSION: Cardiomegaly and small right pleural effusion, unchanged from prior exams. No new or acute abnormality. Lingular opacity on prior chest CT not well visualized radiographically. Electronically Signed   By: Jeb Levering M.D.   On: 12/21/2016 21:15   Ct Angio Chest Pe W Or Wo Contrast  Result Date: 12/22/2016 CLINICAL DATA:  Dyspnea x1 week. Possible pulmonary embolus versus artifact 1 week ago on prior CT. EXAM: CT ANGIOGRAPHY CHEST WITH CONTRAST TECHNIQUE: Multidetector CT imaging of the chest was performed using the standard protocol during bolus administration of intravenous contrast. Multiplanar CT image reconstructions and MIPs were obtained to evaluate the vascular anatomy. CONTRAST:  80 cc of Isovue 370 IV COMPARISON:  12/15/2016 CT FINDINGS: Cardiovascular: Stable cardiomegaly with dilatation the right atrium and retrograde flow of contrast is again noted from the right atrium into the IVC compatible with right heart failure. Coronary arteriosclerosis is again noted of the left main, lad, circumflex and RCA. No pericardial effusion. AICD device is again noted with leads projecting into the right atrium and right ventricle. Moderate atherosclerosis of the thoracic aorta. No aneurysmal dilatation. Once again there are filling defects to the right lower lobe segmental and subsegmental pulmonary arteries in keeping with pulmonary emboli, in similar distribution as noted on prior study, series 5 image 166 through 200. Unfortunately the RV/LV ratio cannot be adequately determined due to lack of contrast within the left ventricle due to timing of the  contrast bolus. Mediastinum/Nodes: No mediastinal nor hilar lymphadenopathy. The esophagus is grossly unremarkable. No mediastinal fluid collection. No thyroid nodules noted. Lungs/Pleura: There is mild upper lobe predominant centrilobular emphysema with stable small right pleural effusion and associated mild compressive atelectasis in the right lower lobe. Minimal left lingular airspace opacity may reflect atelectasis or minimal pneumonia. This is without significant change. Upper Abdomen: Cirrhotic appearing liver with small volume ascites noted in the right upper quadrant. 19 mm low-density nodule of the left adrenal gland may represent a small adenoma. Musculoskeletal: No acute osseous abnormality. Median sternotomy sutures are noted. Small osteophytes along the lower thoracic spine. Review of the MIP images confirms the above findings.  IMPRESSION: Similar filling defect noted within segmental and subsegmental pulmonary arteries of the right lower lobe consistent with pulmonary embolus. Unfortunately due to timing of contrast, the RV/ LV ratio cannot be adequately assessed. No significant convex bowing of the septum towards the left ventricle is seen however. Critical Value/emergent results were called by telephone at the time of interpretation on 12/22/2016 at 1:37 am to Dr. Joseph Berkshire , who verbally acknowledged these results. Minimal lingular airspace opacity may reflect small focus of pneumonia not significantly changed. Stable small right pleural effusion with compressive atelectasis and centrilobular emphysema. Moderate cardiomegaly with coronary arteriosclerosis. Right cardiac dysfunction noted with reflux of contrast into the IVC and hepatic veins. Aortic atherosclerosis. Electronically Signed   By: Ashley Royalty M.D.   On: 12/22/2016 01:38    Procedures Procedures (including critical care time)  Medications Ordered in ED Medications  iopamidol (ISOVUE-370) 76 % injection (100 mLs   Contrast Given 12/22/16 0046)     Initial Impression / Assessment and Plan / ED Course  I have reviewed the triage vital signs and the nursing notes.  Pertinent labs & imaging results that were available during my care of the patient were reviewed by me and considered in my medical decision making (see chart for details).  Clinical Course    Patient presents to the emergency department with complaints of chest pain, heart palpitations and feeling short of breath. Patient has a history of congestive heart failure and COPD. He is here in the area visiting, does not have his oxygen with him. He was just hospitalized this past week for similar symptoms. During that hospitalization there was concern over possible PE, but ultimately it was felt that this was artifact and he did not require any further treatment. He is already anticoagulated on Coumadin, likely because of his paroxysmal atrial fibrillation. He has been supratherapeutic with his INR. He was treated for pneumonia which was seen on CT. At discharge he still does not have oxygenating does not have a nebulizer to use with his albuterol. Symptoms have worsened today. Repeat CT angiography confirms PE at this time. Patient developed a PE while supratherapeutic on Coumadin. This was discussed with Dr. Burr Medico, hematology. She recommends long-term Lovenox treatment, this can be started as IV heparin if necessary. Patient will be admitted for further management.  Final Clinical Impressions(s) / ED Diagnoses   Final diagnoses:  PE (pulmonary thromboembolism) ([place])    New Prescriptions New Prescriptions   No medications on file   I personally performed the services described in this documentation, which was scribed in my presence. The recorded information has been reviewed and is accurate.     Orpah Greek, MD 12/22/16 [place]

## 2016-12-21 NOTE — ED Triage Notes (Signed)
Patient reports SOB with occasional productive cough and anxiety onset last week worse today , currently  taking oral antibiotic for pneumonia , denies fever ore chills .

## 2016-12-22 ENCOUNTER — Emergency Department (HOSPITAL_COMMUNITY): Payer: Medicare Other

## 2016-12-22 ENCOUNTER — Encounter (HOSPITAL_COMMUNITY): Payer: Self-pay | Admitting: Radiology

## 2016-12-22 DIAGNOSIS — J189 Pneumonia, unspecified organism: Secondary | ICD-10-CM

## 2016-12-22 DIAGNOSIS — I5022 Chronic systolic (congestive) heart failure: Secondary | ICD-10-CM | POA: Diagnosis not present

## 2016-12-22 DIAGNOSIS — I509 Heart failure, unspecified: Secondary | ICD-10-CM | POA: Diagnosis not present

## 2016-12-22 DIAGNOSIS — I2699 Other pulmonary embolism without acute cor pulmonale: Secondary | ICD-10-CM | POA: Diagnosis present

## 2016-12-22 DIAGNOSIS — Z87891 Personal history of nicotine dependence: Secondary | ICD-10-CM

## 2016-12-22 DIAGNOSIS — F1021 Alcohol dependence, in remission: Secondary | ICD-10-CM

## 2016-12-22 DIAGNOSIS — K7460 Unspecified cirrhosis of liver: Secondary | ICD-10-CM | POA: Diagnosis not present

## 2016-12-22 DIAGNOSIS — E119 Type 2 diabetes mellitus without complications: Secondary | ICD-10-CM

## 2016-12-22 DIAGNOSIS — Z7901 Long term (current) use of anticoagulants: Secondary | ICD-10-CM

## 2016-12-22 DIAGNOSIS — I4891 Unspecified atrial fibrillation: Secondary | ICD-10-CM

## 2016-12-22 LAB — COMPREHENSIVE METABOLIC PANEL
ALK PHOS: 84 U/L (ref 38–126)
ALT: 35 U/L (ref 17–63)
AST: 44 U/L — ABNORMAL HIGH (ref 15–41)
Albumin: 3.1 g/dL — ABNORMAL LOW (ref 3.5–5.0)
Anion gap: 8 (ref 5–15)
BUN: 25 mg/dL — ABNORMAL HIGH (ref 6–20)
CALCIUM: 8.9 mg/dL (ref 8.9–10.3)
CO2: 28 mmol/L (ref 22–32)
CREATININE: 1.08 mg/dL (ref 0.61–1.24)
Chloride: 97 mmol/L — ABNORMAL LOW (ref 101–111)
Glucose, Bld: 157 mg/dL — ABNORMAL HIGH (ref 65–99)
Potassium: 3.7 mmol/L (ref 3.5–5.1)
Sodium: 133 mmol/L — ABNORMAL LOW (ref 135–145)
Total Bilirubin: 1.1 mg/dL (ref 0.3–1.2)
Total Protein: 6.6 g/dL (ref 6.5–8.1)

## 2016-12-22 LAB — CBC WITH DIFFERENTIAL/PLATELET
BASOS PCT: 0 %
Basophils Absolute: 0 10*3/uL (ref 0.0–0.1)
EOS ABS: 0.2 10*3/uL (ref 0.0–0.7)
EOS PCT: 2 %
HCT: 42.2 % (ref 39.0–52.0)
HEMOGLOBIN: 13.5 g/dL (ref 13.0–17.0)
Lymphocytes Relative: 22 %
Lymphs Abs: 2 10*3/uL (ref 0.7–4.0)
MCH: 27.8 pg (ref 26.0–34.0)
MCHC: 32 g/dL (ref 30.0–36.0)
MCV: 87 fL (ref 78.0–100.0)
MONOS PCT: 12 %
Monocytes Absolute: 1.1 10*3/uL — ABNORMAL HIGH (ref 0.1–1.0)
Neutro Abs: 5.7 10*3/uL (ref 1.7–7.7)
Neutrophils Relative %: 64 %
PLATELETS: 135 10*3/uL — AB (ref 150–400)
RBC: 4.85 MIL/uL (ref 4.22–5.81)
RDW: 15.9 % — AB (ref 11.5–15.5)
WBC: 9 10*3/uL (ref 4.0–10.5)

## 2016-12-22 LAB — GLUCOSE, CAPILLARY: GLUCOSE-CAPILLARY: 278 mg/dL — AB (ref 65–99)

## 2016-12-22 LAB — PROTIME-INR
INR: 3.34
PROTHROMBIN TIME: 34.6 s — AB (ref 11.4–15.2)

## 2016-12-22 LAB — TROPONIN I
Troponin I: <0.03 ng/mL (ref <0.03)
Troponin I: <0.03 ng/mL (ref <0.03)
Troponin I: 0.06 ng/mL (ref <0.03)

## 2016-12-22 MED ORDER — ONDANSETRON HCL 4 MG PO TABS: 4.0000 mg | ORAL_TABLET | Freq: Four times a day (QID) | ORAL | Status: DC | PRN

## 2016-12-22 MED ORDER — SIMVASTATIN 40 MG PO TABS
40.0000 mg | ORAL_TABLET | Freq: Every day | ORAL | Status: DC
  Administered 2016-12-22 – 2016-12-23 (×2): 40 mg via ORAL
  Filled 2016-12-22 (×2): qty 1

## 2016-12-22 MED ORDER — SOTALOL HCL 80 MG PO TABS
80.0000 mg | ORAL_TABLET | Freq: Two times a day (BID) | ORAL | Status: DC
  Administered 2016-12-22 – 2016-12-24 (×5): 80 mg via ORAL
  Filled 2016-12-22 (×5): qty 1

## 2016-12-22 MED ORDER — IOPAMIDOL (ISOVUE-370) INJECTION 76%
INTRAVENOUS | Status: AC
Start: 2016-12-22 — End: 2016-12-22
  Administered 2016-12-22: 100 mL
  Filled 2016-12-22: qty 100

## 2016-12-22 MED ORDER — ENOXAPARIN SODIUM 100 MG/ML ~~LOC~~ SOLN
85.0000 mg | Freq: Two times a day (BID) | SUBCUTANEOUS | Status: DC
  Administered 2016-12-22 (×2): 85 mg via SUBCUTANEOUS
  Filled 2016-12-22 (×2): qty 1

## 2016-12-22 MED ORDER — CALCIUM CARBONATE ANTACID 500 MG PO CHEW: 1.0000 | CHEWABLE_TABLET | Freq: Every day | ORAL | Status: DC | PRN

## 2016-12-22 MED ORDER — LEVOFLOXACIN 500 MG PO TABS
500.0000 mg | ORAL_TABLET | Freq: Every day | ORAL | Status: DC
  Administered 2016-12-22 – 2016-12-24 (×3): 500 mg via ORAL
  Filled 2016-12-22 (×3): qty 1

## 2016-12-22 MED ORDER — ISOSORBIDE MONONITRATE 20 MG PO TABS
10.0000 mg | ORAL_TABLET | Freq: Every day | ORAL | Status: DC
  Administered 2016-12-22 – 2016-12-23 (×3): 10 mg via ORAL
  Filled 2016-12-22 (×3): qty 1

## 2016-12-22 MED ORDER — RIVAROXABAN 20 MG PO TABS: 20.0000 mg | ORAL_TABLET | Freq: Every day | ORAL | Status: DC

## 2016-12-22 MED ORDER — POTASSIUM CHLORIDE CRYS ER 20 MEQ PO TBCR
20.0000 meq | EXTENDED_RELEASE_TABLET | Freq: Every day | ORAL | Status: DC
  Administered 2016-12-22 – 2016-12-23 (×2): 20 meq via ORAL
  Filled 2016-12-22 (×2): qty 1

## 2016-12-22 MED ORDER — FUROSEMIDE 80 MG PO TABS
160.0000 mg | ORAL_TABLET | Freq: Every day | ORAL | Status: DC
  Administered 2016-12-22 – 2016-12-24 (×3): 160 mg via ORAL
  Filled 2016-12-22 (×3): qty 2

## 2016-12-22 MED ORDER — IPRATROPIUM-ALBUTEROL 0.5-2.5 (3) MG/3ML IN SOLN: 3.0000 mL | RESPIRATORY_TRACT | Status: DC | PRN

## 2016-12-22 MED ORDER — HEPARIN (PORCINE) IN NACL 100-0.45 UNIT/ML-% IJ SOLN
1300.0000 [IU]/h | INTRAMUSCULAR | Status: DC
  Administered 2016-12-22: 1300 [IU]/h via INTRAVENOUS
  Filled 2016-12-22: qty 250

## 2016-12-22 MED ORDER — HYDROCODONE-ACETAMINOPHEN 5-325 MG PO TABS
1.0000 | ORAL_TABLET | ORAL | Status: DC | PRN
  Administered 2016-12-22 – 2016-12-23 (×2): 2 via ORAL
  Filled 2016-12-22 (×2): qty 2

## 2016-12-22 MED ORDER — ACETAMINOPHEN 325 MG PO TABS: 650.0000 mg | ORAL_TABLET | Freq: Four times a day (QID) | ORAL | Status: DC | PRN

## 2016-12-22 MED ORDER — ONDANSETRON HCL 4 MG/2ML IJ SOLN: 4.0000 mg | Freq: Four times a day (QID) | INTRAMUSCULAR | Status: DC | PRN

## 2016-12-22 MED ORDER — INSULIN GLARGINE 100 UNIT/ML ~~LOC~~ SOLN
15.0000 [IU] | Freq: Every day | SUBCUTANEOUS | Status: DC
  Administered 2016-12-22 – 2016-12-23 (×2): 15 [IU] via SUBCUTANEOUS
  Filled 2016-12-22 (×2): qty 0.15

## 2016-12-22 MED ORDER — RIVAROXABAN 15 MG PO TABS
15.0000 mg | ORAL_TABLET | Freq: Two times a day (BID) | ORAL | Status: DC
  Administered 2016-12-23 – 2016-12-24 (×4): 15 mg via ORAL
  Filled 2016-12-22 (×4): qty 1

## 2016-12-22 MED ORDER — CARVEDILOL 3.125 MG PO TABS
3.1250 mg | ORAL_TABLET | Freq: Two times a day (BID) | ORAL | Status: DC
  Administered 2016-12-22 – 2016-12-23 (×4): 3.125 mg via ORAL
  Filled 2016-12-22 (×4): qty 1

## 2016-12-22 MED ORDER — COLLAGENASE 250 UNIT/GM EX OINT
TOPICAL_OINTMENT | Freq: Every day | CUTANEOUS | Status: DC
  Administered 2016-12-22: 1 via TOPICAL
  Administered 2016-12-23 – 2016-12-24 (×2): via TOPICAL
  Filled 2016-12-22: qty 30

## 2016-12-22 MED ORDER — ACETAMINOPHEN 650 MG RE SUPP: 650.0000 mg | Freq: Four times a day (QID) | RECTAL | Status: DC | PRN

## 2016-12-22 MED ORDER — CA CARBONATE-MAG HYDROXIDE 550-110 MG PO CHEW: 1.0000 | CHEWABLE_TABLET | Freq: Every day | ORAL | Status: DC | PRN

## 2016-12-22 NOTE — Progress Notes (Addendum)
ANTICOAGULATION CONSULT NOTE - Initial Consult  Pharmacy Consult for Heparin Indication: pulmonary embolus  No Known Allergies  Patient Measurements: Height: 5\' 9"  (175.3 cm) Weight: 195 lb (88.5 kg) IBW/kg (Calculated) : 70.7 Heparin Dosing Weight: 88 kg  Vital Signs: Temp: 97.5 F (36.4 C) (12/27 1936) Temp Source: Oral (12/27 1936) BP: 133/81 (12/28 0200) Pulse Rate: 97 (12/28 0200)  Labs:  Recent Labs  12/19/16 0424 12/21/16 1947  HGB 13.0 14.0  HCT 39.7 42.9  PLT 123* 152  LABPROT 28.3* 34.4*  INR 2.60 3.31  CREATININE 1.23 1.24    Estimated Creatinine Clearance: 58.4 mL/min (by C-G formula based on SCr of 1.24 mg/dL).   Medical History: Past Medical History:  Diagnosis Date  . Cancer ([place])   . CHF (congestive heart failure) ([place])   . COPD (chronic obstructive pulmonary disease) ([place])   . Coronary artery disease   . Diabetes mellitus without complication ([place])   . Hyperlipidemia   . Hypertension     Medications:  See electronic med rec  Assessment: 75 y.o. M presents with SOB and anxiety. Pt on coumadin PTA for atrial fibrillation. He has been supratherapeutic with his INR (recent admission 12/21-12/25). He was treated for pneumonia which was seen on CT. Repeat CT angiography today confirms PE. Patient developed a PE while supratherapeutic on Coumadin. ED MD discussed with Dr. Burr Medico, hematology. She recommends long-term Lovenox treatment, this can be started as IV heparin if necessary. Order for heparin per pharmacy. Noted admission INR 3.31. CBC ok on admission.  Goal of Therapy:  Heparin level 0.3-0.7 units/ml Monitor platelets by anticoagulation protocol: Yes   Plan:  Heparin gtt at 1300 units/hr. Will not bolus with INR 3.31. Will f/u heparin level in 8 hours Daily heparin level and CBC and INR F/u transition to long-term Lovenox per hematology recommendations   Sherlon Handing, PharmD, BCPS Clinical pharmacist, pager [phone] 12/22/2016,3:11 AM    Addendum: Heparin being changed to Lovenox  Plan: D/c Heparin  Give Lovenox 85 mg SQ q12h. First dose one hour after heparin d/c. CBC q72h while pt on Lovenox Will plan to change Lovenox to 1.5mg  SQ q24h at discharge  Sherlon Handing, PharmD, [place] pharmacist, pager [phone] 12/22/2016 5:10 AM

## 2016-12-22 NOTE — Progress Notes (Signed)
Tele called and stated had 9 beats of Vtach; did not sustain.

## 2016-12-22 NOTE — Progress Notes (Signed)
ANTICOAGULATION CONSULT NOTE - Follow Up Consult  Pharmacy Consult for Lovenox >> Xarelto Indication: pulmonary embolus  No Known Allergies  Patient Measurements: Height: 5\' 9"  (175.3 cm) Weight: 189 lb 14.4 oz (86.1 kg) IBW/kg (Calculated) : 70.7  Vital Signs: Temp: 97.5 F (36.4 C) (12/28 1242) Temp Source: Oral (12/28 1242) BP: 104/64 (12/28 1242) Pulse Rate: 63 (12/28 1242)  Labs:  Recent Labs  12/21/16 1947 12/22/16 0502 12/22/16 1033 12/22/16 1613  HGB 14.0 13.5  --   --   HCT 42.9 42.2  --   --   PLT 152 135*  --   --   LABPROT 34.4*  --  34.6*  --   INR 3.31  --  3.34  --   CREATININE 1.24 1.08  --   --   TROPONINI  --  <0.03 <0.03 0.06*    Estimated Creatinine Clearance: 66.3 mL/min (by C-G formula based on SCr of 1.08 mg/dL).  Assessment: 84 YOM from [place] visiting family for the holidays who was admitted from 12/21 - 12/25 with PNA and was shown to have a possible PE. He represented on 12/28 with continued SOB and was switched to lovenox for PE treatment. The patient is noted to be on chronic warfarin for Afib with admit INR of 4.54 on 12/21 and 3.31 on 12/27.  Hematology has now been consulted and have asked pharmacy to transition the patient to [place] as deemed a "warfarin failure"  The patient has already received lovenox 85 mg at 1700 this afternoon. Will plan to transition to Xarelto on 12/29 AM. AST mildly elevated at 44, ALT 35. INR 3.34  Goal of Therapy:  Appropriate anticoagulation for indication and hepatic/renal function    Plan:  1. Discontinue Lovenox 2. Start Xarelto 15 mg twice daily for 21 days starting on 12/29 (thru 1/18) 3. Transition to Xarelto 20 mg once daily with supper starting on 1/19 4. Pharmacy will plan to educate prior to discharge 5. Will monitor for bleeding and renal function for any necessary dose adjustments  Thank you for allowing pharmacy to be a part of this patient's care.  Alycia Rossetti, PharmD,  BCPS Clinical Pharmacist Pager: [phone] 12/22/2016 8:02 PM

## 2016-12-22 NOTE — Consult Note (Signed)
Referral MD  Reason for Referral: Pulmonary embolism while on therapeutic Coumadin   Chief Complaint  Patient presents with  . Shortness of Breath  . Anxiety  : I had a blood clot in my lung.  HPI: Mr. Dorward is an incredibly interesting 75 year old white male. He is originally from [place]. He lives in [place]. He lives in [place]. He is down here visiting his daughter for Christmas.  He's been on long-term Coumadin. He has atrial fibrillation. He has congestive heart failure with an ejection fraction of 25%.  He was in the hospital a couple days ago. He apparently had pneumonia. He was discharged. He then came back with worsening shortness of breath and chest pain. A CT angiogram was done which showed that he had a pulmonary embolism. There is no heart strain. At the time he was therapeutic on Coumadin.  He is on Lovenox right now. He is doing okay.  He has a lot of medical problems. The biggest problem is his cardiac status. He has a defibrillator in.  He has diabetes. 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 He says he was in the hospital for 6 months when he was to resolve. He said he was in the basement of the local hospital with hot water bottles being put on his legs.  He is to smoke. He probably has about a 70-pack-year history of tobacco use. He stopped probably 10 years ago. He said he used Chantix to stop smoking.  There is no history of blood clots in the family.  He flew to [place] before he came down here for Christmas. I think he has a sister in [place].  He is active of in [place]. He is retired. He had several jobs when he was working.  He did serve in the [employer]. He was in the [employer].  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 He does have a history of significant alcohol use. His drink of choice was whiskey. He currently does not drink.  Results from today show White cell count 9. Hemoglobin 13.5. Platelet count 135,000. His sodium is 133. Potassium 3.7. Creatinine  1.08. Liver tests are okay.  He did have Dopplers of his legs done on December 22. There is no evidence of thromboembolic disease.  He was tested for HIV on December 21. This was negative.  There's been no weight loss or weight gain. His appetite has been okay. He's had no nausea or vomiting. He's had no change in bowel or bladder habits.  Overall, I would have to say that his performance status prior to hospitalization was probably ECOG 1.   Past Medical History:  Diagnosis Date  . Cancer ([facility])   . CHF (congestive heart failure) ([facility])   . COPD (chronic obstructive pulmonary disease) ([facility])   . Coronary artery disease   . Diabetes mellitus without complication ([facility])   . Hyperlipidemia   . Hypertension   :  History reviewed. No pertinent surgical history.:   Current Facility-Administered Medications:  .  acetaminophen (TYLENOL) tablet 650 mg, 650 mg, Oral, Q6H PRN **OR** acetaminophen (TYLENOL) suppository 650 mg, 650 mg, Rectal, Q6H PRN, Rise Patience, MD .  calcium carbonate (TUMS - dosed in mg elemental calcium) chewable tablet 200 mg of elemental calcium, 1 tablet, Oral, Daily PRN, Rise Patience, MD .  carvedilol (COREG) tablet 3.125 mg, 3.125 mg, Oral, BID WC, Rise Patience, MD, 3.125 mg at 12/22/16 1702 .  collagenase (SANTYL) ointment, , Topical, Daily, Anand D Hongalgi,  MD, 1 application at [id number] .  enoxaparin (LOVENOX) injection 85 mg, 85 mg, Subcutaneous, Q12H, Franky Macho, RPH, 85 mg at 12/22/16 1703 .  furosemide (LASIX) tablet 160 mg, 160 mg, Oral, Daily, Rise Patience, MD, 160 mg at 12/22/16 0852 .  HYDROcodone-acetaminophen (NORCO/VICODIN) 5-325 MG per tablet 1-2 tablet, 1-2 tablet, Oral, Q4H PRN, Rise Patience, MD, 2 tablet at 12/22/16 0531 .  insulin glargine (LANTUS) injection 15 Units, 15 Units, Subcutaneous, QHS, Rise Patience, MD .  ipratropium-albuterol (DUONEB) 0.5-2.5 (3) MG/3ML nebulizer solution 3 mL, 3 mL,  Nebulization, Q4H PRN, Rise Patience, MD .  isosorbide mononitrate (ISMO,MONOKET) tablet 10 mg, 10 mg, Oral, Q2000, Rise Patience, MD, 10 mg at 12/22/16 0531 .  levofloxacin (LEVAQUIN) tablet 500 mg, 500 mg, Oral, Daily, Rise Patience, MD, 500 mg at 12/22/16 [id number] .  ondansetron (ZOFRAN) tablet 4 mg, 4 mg, Oral, Q6H PRN **OR** ondansetron (ZOFRAN) injection 4 mg, 4 mg, Intravenous, Q6H PRN, Rise Patience, MD .  potassium chloride SA (K-DUR,KLOR-CON) CR tablet 20 mEq, 20 mEq, Oral, Daily, Rise Patience, MD, 20 mEq at 12/22/16 1000 .  simvastatin (ZOCOR) tablet 40 mg, 40 mg, Oral, q1800, Rise Patience, MD, 40 mg at 12/22/16 1703 .  sotalol (BETAPACE) tablet 80 mg, 80 mg, Oral, BID, Rise Patience, MD, 80 mg at 12/22/16 0853:  . carvedilol  3.125 mg Oral BID WC  . collagenase   Topical Daily  . enoxaparin (LOVENOX) injection  85 mg Subcutaneous Q12H  . furosemide  160 mg Oral Daily  . insulin glargine  15 Units Subcutaneous QHS  . isosorbide mononitrate  10 mg Oral Q2000  . levofloxacin  500 mg Oral Daily  . potassium chloride SA  20 mEq Oral Daily  . simvastatin  40 mg Oral q1800  . sotalol  80 mg Oral BID  :  No Known Allergies:  Family History  Problem Relation Age of Onset  . Obesity Daughter   :  Social History   Social History  . Marital status: Single    Spouse name: N/A  . Number of children: N/A  . Years of education: N/A   Occupational History  . Not on file.   Social History Main Topics  . Smoking status: Former Research scientist (life sciences)  . Smokeless tobacco: Never Used  . Alcohol use No  . Drug use: No  . Sexual activity: Not on file   Other Topics Concern  . Not on file   Social History Narrative  . No narrative on file  :  Pertinent items are noted in HPI.  Exam: Patient Vitals for the past 24 hrs:  BP Temp Temp src Pulse Resp SpO2 Height Weight  12/22/16 1242 104/64 97.5 F (36.4 C) Oral 63 18 100 % - -  12/22/16 0459 127/70  97.7 F (36.5 C) Oral 71 18 100 % 5\' 9"  (1.753 m) 189 lb 14.4 oz (86.1 kg)  12/22/16 0415 115/68 - - 71 - 98 % - -  12/22/16 0300 131/76 - - 73 - 100 % - -  12/22/16 0230 120/74 - - 73 - 100 % - -  12/22/16 0200 133/81 - - 97 - 100 % - -  12/22/16 0117 115/67 - - 71 18 100 % - -  12/22/16 0030 123/73 - - 71 16 100 % - -  12/22/16 0000 121/73 - - 70 18 100 % - -  12/21/16 2330 126/75 - - 71 20  100 % - -  12/21/16 2306 104/60 - - 69 (!) 7 100 % - -  12/21/16 2248 114/65 - - 64 18 (!) 85 % - -   As above    Recent Labs  12/21/16 1947 12/22/16 0502  WBC 8.4 9.0  HGB 14.0 13.5  HCT 42.9 42.2  PLT 152 135*    Recent Labs  12/21/16 1947 12/22/16 0502  NA 134* 133*  K 4.5 3.7  CL 99* 97*  CO2 24 28  GLUCOSE 223* 157*  BUN 28* 25*  CREATININE 1.24 1.08  CALCIUM 9.4 8.9    Blood smear review:  None  Pathology: None     Assessment and Plan: Mr. Seraphin is a very charming 75 year old white male. Of note, he is not any relationship to [place] favored in World War II.  I suppose I should not be surprised that he had pulmonary embolism on Coumadin. He could've been therapeutic on Coumadin and this could've easily happened.  He definitely needs lifelong anticoagulation. He needs it because of the atrial fibrillation that he had and also the congestive heart failure with his poor ejection fraction.  He has decent kidney function. He is fairly active. He is fairly aware of what is going on around him. He has a good outlook.  I do not see see why he cannot be on one of the NOACs. I would prefer Xarelto. I'm pretty sure that the reversal agent for Xarelto will be approved by the FDA soon. I don't see any contra indication prior to being on Xarelto. It's only would be easy for him to use given all his medications.  I talked to him about Xarelto. I told him that he would need a "loading dose" of 15 mg twice a day for 21 days and then he will go on a  maintenance dose of 20 mg. I would think that he would need the 20 mg dose for quite a while.  He has several doctors up in [place] that he can go to for follow-up.  I don't think a hypercoagulable panel needs to be done. There is no history in the family of blood clots. He's had multiple surgeries in the past without any issues with blood clots. His risk factor for my point of view is clearly the congestive heart failure then the atrial fibrillation. In addition he has diabetes.  I will let pharmacy do the dosing. He will get off the Lovenox.  I typically follow-up with CT angiograms in about 3 months to see how the pulmonary emboli have responded.  If, for some reason, there is no response, which I would think would be incredibly rare, then I could understand him being on Lovenox or Arixtra. These, being injectable, would be incredibly inconvenient for him.  I spent about an hour with him. He is really nice. It was a lot of fun talking to him about [place] and the different things that go on in [place].  Lattie Haw, MD  Oswaldo Milian 9:6

## 2016-12-22 NOTE — Plan of Care (Signed)
Problem: Pain Managment: Goal: General experience of comfort will improve Outcome: Not Progressing Pt has chronic back pain

## 2016-12-22 NOTE — H&P (Signed)
History and Physical    Edward Meyer [id number] DOB: 10-05-1941 DOA: 12/21/2016  PCP: Pcp Not In System  Patient coming from: Home.  Chief Complaint: Shortness of breath.  HPI: Edward Meyer is a 75 y.o. male with history of COPD, chronic atrial fibrillation, chronic systolic heart failure, CAD and diabetes mellitus type 2 presents to the ER because of shortness of breath. Patient was recently admitted to the hospital and discharged on December 25 for pneumonia. Patient's shortness of breath worsened last night increased on lying down denies any chest pain but did have back pain with no associated productive cough fever or chills. CT angiogram of the chest was repeated again which shows pulmonary embolism involving the right side. There also was left lingular pneumonia. Since patient already has therapeutic INR ER physician had discussed with on-call hematologist who advised patient to be changed to Lovenox.   ED Course: CT angiogram of the chest shows pulmonary embolism. Patient started on Lovenox because patient had PE despite being on Coumadin.  Review of Systems: As per HPI, rest all negative.   Past Medical History:  Diagnosis Date  . Cancer ([facility])   . CHF (congestive heart failure) ([facility])   . COPD (chronic obstructive pulmonary disease) ([facility])   . Coronary artery disease   . Diabetes mellitus without complication ([facility])   . Hyperlipidemia   . Hypertension     History reviewed. No pertinent surgical history.   reports that he has quit smoking. He has never used smokeless tobacco. He reports that he does not drink alcohol or use drugs.  No Known Allergies  Family History  Problem Relation Age of Onset  . Obesity Daughter     Prior to Admission medications   Medication Sig Start Date End Date Taking? Authorizing Provider  Ca Carbonate-Mag Hydroxide (ROLAIDS) 550-110 MG CHEW Chew 1 each by mouth daily as needed (heartburn).   Yes Historical Provider, MD  carvedilol (COREG)  3.125 MG tablet Take 1 tablet (3.125 mg total) by mouth 2 (two) times daily with a meal. 12/19/16  Yes Theodis Blaze, MD  furosemide (LASIX) 40 MG tablet Take 160 mg by mouth daily.   Yes Historical Provider, MD  HYDROcodone-acetaminophen (NORCO/VICODIN) 5-325 MG tablet Take 1-2 tablets by mouth every 4 (four) hours as needed for moderate pain or severe pain. 12/19/16  Yes Theodis Blaze, MD  insulin glargine (LANTUS) 100 UNIT/ML injection Inject 0.15 mLs (15 Units total) into the skin at bedtime. 12/19/16  Yes Theodis Blaze, MD  ipratropium-albuterol (DUONEB) 0.5-2.5 (3) MG/3ML SOLN Take 3 mLs by nebulization every 4 (four) hours as needed. 12/19/16  Yes Theodis Blaze, MD  isosorbide mononitrate (ISMO,MONOKET) 10 MG tablet Take 1 tablet (10 mg total) by mouth daily at 8 pm. 12/19/16  Yes Theodis Blaze, MD  levofloxacin (LEVAQUIN) 500 MG tablet Take 1 tablet (500 mg total) by mouth daily. 12/20/16  Yes Theodis Blaze, MD  potassium chloride SA (K-DUR,KLOR-CON) 20 MEQ tablet Take 20 mEq by mouth daily.   Yes Historical Provider, MD  simvastatin (ZOCOR) 40 MG tablet Take 40 mg by mouth daily at 6 PM.   Yes Historical Provider, MD  sotalol (BETAPACE) 80 MG tablet Take 80 mg by mouth 2 (two) times daily.   Yes Historical Provider, MD  warfarin (COUMADIN) 5 MG tablet Take 5-7.5 mg by mouth See admin instructions. Alternate taking 5 mg on one day then take 7.5 mg the next day   Yes Historical Provider,  MD  OXYGEN Inhale 2 L into the lungs at bedtime as needed (for SOB).     Historical Provider, MD    Physical Exam: Vitals:   12/22/16 0200 12/22/16 0230 12/22/16 0300 12/22/16 0415  BP: 133/81 120/74 131/76 115/68  Pulse: 97 73 73 71  Resp:      Temp:      TempSrc:      SpO2: 100% 100% 100% 98%  Weight:      Height:          Constitutional: Moderately built and nourished. Vitals:   12/22/16 0200 12/22/16 0230 12/22/16 0300 12/22/16 0415  BP: 133/81 120/74 131/76 115/68  Pulse: 97 73 73 71    Resp:      Temp:      TempSrc:      SpO2: 100% 100% 100% 98%  Weight:      Height:       Eyes: Anicteric. no pallor. ENMT: No discharge from the ears eyes nose or mouth. Neck: No mass felt. No JVD appreciated. Respiratory: No rhonchi or crepitations. Cardiovascular: S1 and S2 heard. No murmurs appreciated. Abdomen: Soft nontender bowel sounds present. No guarding or rigidity. Musculoskeletal: No edema. Skin: Chronic skin changes in the lower extremity. Neurologic: Alert awake oriented to time place and person. Moves all extremities. Psychiatric: Appears normal. Normal affect.   Labs on Admission: I have personally reviewed following labs and imaging studies  CBC:  Recent Labs Lab 12/16/16 0523 12/17/16 0836 12/18/16 0341 12/19/16 0424 12/21/16 1947  WBC 13.0* 9.1 8.4 6.5 8.4  NEUTROABS 8.4*  --   --   --   --   HGB 14.0 13.2 12.5* 13.0 14.0  HCT 43.2 40.4 38.4* 39.7 42.9  MCV 85.7 85.1 85.0 85.6 85.0  PLT 149* 142* 116* 123* 0000000   Basic Metabolic Panel:  Recent Labs Lab 12/16/16 0523 12/17/16 0523 12/18/16 0341 12/19/16 0424 12/21/16 1947  NA 137 136 133* 133* 134*  K 4.5 3.6 4.1 4.5 4.5  CL 103 102 98* 98* 99*  CO2 26 27 27 31 24   GLUCOSE 114* 72 184* 160* 223*  BUN 23* 20 25* 24* 28*  CREATININE 1.28* 1.27* 1.33* 1.23 1.24  CALCIUM 9.1 8.5* 8.6* 8.8* 9.4  MG  --   --  2.1  --   --    GFR: Estimated Creatinine Clearance: 58.4 mL/min (by C-G formula based on SCr of 1.24 mg/dL). Liver Function Tests:  Recent Labs Lab 12/17/16 0523  AST 25  ALT 17  ALKPHOS 65  BILITOT 1.2  PROT 5.8*  ALBUMIN 3.0*   No results for input(s): LIPASE, AMYLASE in the last 168 hours. No results for input(s): AMMONIA in the last 168 hours. Coagulation Profile:  Recent Labs Lab 12/16/16 0523 12/17/16 0836 12/18/16 0341 12/19/16 0424 12/21/16 1947  INR 5.02* 3.82 2.78 2.60 3.31   Cardiac Enzymes: No results for input(s): CKTOTAL, CKMB, CKMBINDEX, TROPONINI in  the last 168 hours. BNP (last 3 results) No results for input(s): PROBNP in the last 8760 hours. HbA1C: No results for input(s): HGBA1C in the last 72 hours. CBG:  Recent Labs Lab 12/18/16 0731 12/18/16 1108 12/18/16 1709 12/18/16 2141 12/19/16 0820  GLUCAP 152* 130* 150* 190* 167*   Lipid Profile: No results for input(s): CHOL, HDL, LDLCALC, TRIG, CHOLHDL, LDLDIRECT in the last 72 hours. Thyroid Function Tests: No results for input(s): TSH, T4TOTAL, FREET4, T3FREE, THYROIDAB in the last 72 hours. Anemia Panel: No results for input(s): VITAMINB12, FOLATE,  FERRITIN, TIBC, IRON, RETICCTPCT in the last 72 hours. Urine analysis: No results found for: COLORURINE, APPEARANCEUR, LABSPEC, PHURINE, GLUCOSEU, HGBUR, BILIRUBINUR, KETONESUR, PROTEINUR, UROBILINOGEN, NITRITE, LEUKOCYTESUR Sepsis Labs: @LABRCNTIP (procalcitonin:4,lacticidven:4) )No results found for this or any previous visit (from the past 240 hour(s)).   Radiological Exams on Admission: Dg Chest 2 View  Result Date: 12/21/2016 CLINICAL DATA:  Shortness of breath and cough for 1 week. Recent hospitalization and discharge without reported improvement symptoms. EXAM: CHEST  2 VIEW COMPARISON:  Radiographs and CT 12/15/2016 FINDINGS: Patient is post median sternotomy. Dual lead left-sided pacemaker remains in place. The heart size and mediastinal contours are unchanged with mild cardiomegaly. Coronary stent versus calcifications. No pulmonary edema. Small right pleural effusion appears similar to prior exams. Lingular opacity on chest CT not well visualized radiographically. No pneumothorax. Stable degenerative change in the spine. IMPRESSION: Cardiomegaly and small right pleural effusion, unchanged from prior exams. No new or acute abnormality. Lingular opacity on prior chest CT not well visualized radiographically. Electronically Signed   By: Jeb Levering M.D.   On: 12/21/2016 21:15   Ct Angio Chest Pe W Or Wo  Contrast  Result Date: 12/22/2016 CLINICAL DATA:  Dyspnea x1 week. Possible pulmonary embolus versus artifact 1 week ago on prior CT. EXAM: CT ANGIOGRAPHY CHEST WITH CONTRAST TECHNIQUE: Multidetector CT imaging of the chest was performed using the standard protocol during bolus administration of intravenous contrast. Multiplanar CT image reconstructions and MIPs were obtained to evaluate the vascular anatomy. CONTRAST:  80 cc of Isovue 370 IV COMPARISON:  12/15/2016 CT FINDINGS: Cardiovascular: Stable cardiomegaly with dilatation the right atrium and retrograde flow of contrast is again noted from the right atrium into the IVC compatible with right heart failure. Coronary arteriosclerosis is again noted of the left main, lad, circumflex and RCA. No pericardial effusion. AICD device is again noted with leads projecting into the right atrium and right ventricle. Moderate atherosclerosis of the thoracic aorta. No aneurysmal dilatation. Once again there are filling defects to the right lower lobe segmental and subsegmental pulmonary arteries in keeping with pulmonary emboli, in similar distribution as noted on prior study, series 5 image 166 through 200. Unfortunately the RV/LV ratio cannot be adequately determined due to lack of contrast within the left ventricle due to timing of the contrast bolus. Mediastinum/Nodes: No mediastinal nor hilar lymphadenopathy. The esophagus is grossly unremarkable. No mediastinal fluid collection. No thyroid nodules noted. Lungs/Pleura: There is mild upper lobe predominant centrilobular emphysema with stable small right pleural effusion and associated mild compressive atelectasis in the right lower lobe. Minimal left lingular airspace opacity may reflect atelectasis or minimal pneumonia. This is without significant change. Upper Abdomen: Cirrhotic appearing liver with small volume ascites noted in the right upper quadrant. 19 mm low-density nodule of the left adrenal gland may  represent a small adenoma. Musculoskeletal: No acute osseous abnormality. Median sternotomy sutures are noted. Small osteophytes along the lower thoracic spine. Review of the MIP images confirms the above findings. IMPRESSION: Similar filling defect noted within segmental and subsegmental pulmonary arteries of the right lower lobe consistent with pulmonary embolus. Unfortunately due to timing of contrast, the RV/ LV ratio cannot be adequately assessed. No significant convex bowing of the septum towards the left ventricle is seen however. Critical Value/emergent results were called by telephone at the time of interpretation on 12/22/2016 at 1:37 am to Dr. Joseph Berkshire , who verbally acknowledged these results. Minimal lingular airspace opacity may reflect small focus of pneumonia not significantly changed. Stable  small right pleural effusion with compressive atelectasis and centrilobular emphysema. Moderate cardiomegaly with coronary arteriosclerosis. Right cardiac dysfunction noted with reflux of contrast into the IVC and hepatic veins. Aortic atherosclerosis. Electronically Signed   By: Ashley Royalty M.D.   On: 12/22/2016 01:38    EKG: Independently reviewed. Normal sinus rhythm with nonspecific T-wave changes.  Assessment/Plan Principal Problem:   PE (pulmonary thromboembolism) ([facility]) Active Problems:   COPD (chronic obstructive pulmonary disease) ([facility])   Hypertension   Insulin-requiring or dependent type II diabetes mellitus ([facility])   CAD (coronary artery disease)   Cirrhosis of liver ([facility])   Chronic CHF (congestive heart failure) ([facility])   CAP (community acquired pneumonia)   Pulmonary embolism ([place])    1. Pulmonary embolism - patient has been changed to Lovenox since patient has PE despite being having therapeutic INR. Cycle cardiac markers and closely observe in telemetry. Patient has had a 2-D echo done last week which showed EF of 20-25% with moderate mitral regurgitation. 2. Chronic  systolic heart failure last EF measured was last week showing EF of 20-25% with moderate mitral regurgitation - patient is on Lasix 160 mg by mouth daily. Follow daily weights and intake output and metabolic panel. 3. COPD - currently not wheezing. 4. Diabetes mellitus type 2 - on Lantus and sliding scale coverage. 5. Chronic atrial fibrillation - chads 2 vasc score more than 2. On Coreg and sotalol. Patient is on Lovenox at this time. 6. Pneumonia - continue Levaquin.   DVT prophylaxis: Lovenox. Code Status: Full code.  Family Communication: Patient's daughter and wife.  Disposition Plan: Home.  Consults called: None.  Admission status: Observation.    Rise Patience MD [facility] Pager [phone].  If 7PM-7AM, please contact night-coverage [email or web address] Password [facility]  12/22/2016, 4:56 AM

## 2016-12-22 NOTE — Consult Note (Signed)
[facility] Nurse wound consult note Reason for Consult: LLE wounds Patient was just seen by this [place] nurse last week on 12/16/16 Patient has been re-admitted No edema, palpable but weak pulses. Wound type:mixed etiology, full thickness ulcer LLE Previously patient had 4 wounds, today he only has one remaining open wound distal pretibial area Measurement: 2cm x 3.0cm x 0.1cm  Wound bed: 10% pink, 90% yellow Drainage (amount, consistency, odor) none Periwound: intact, with evidence of reepithelialization of the previous wounds Dressing procedure/placement/frequency: Apply Santyl to the remaining open wound, 1/4" thick layer and moist 2x2 gauze.  Cover with dry dressing and kerlix. Change daily.  Discussed POC with patient and bedside nurse.  Re consult if needed, will not follow at this time. Thanks  [employer], RN,CWOCN, CNS [phone])

## 2016-12-22 NOTE — Progress Notes (Signed)
PROGRESS NOTE  Donold Sheffler  [id number] DOB: March 13, 1941  DOA: 12/21/2016 PCP: Pcp Not In System Patient's PCP and primary cardiologist in [place].  Brief Narrative:  75 year old male, resident of [place], visiting daughter in [place], [place], with PMH of chronic respiratory failure on home oxygen 2 L/m at bedtime, COPD, chronic atrial fibrillation on Coumadin anticoagulation prior to admission, chronic systolic CHF, CAD, DM 2/IDDM, HTN, recent hospitalization 12/15/16-12/19/16 for complaints of dyspnea, productive cough and upper back/left shoulder pain at which time he had a CTA chest suspicious for acute PE but lower extremity Dopplers were negative for DVT, case was discussed with radiologist and findings were felt to be related to artifact rather than acute PE and he was treated for possible left lower lobe pneumonia, now presented back in 48 hours later with worsening dyspnea without productive cough, fever or chills. Repeat CTA chest shows PE in the right side. Admitted for further evaluation and management.   Assessment & Plan:   Principal Problem:   PE (pulmonary thromboembolism) ([place]) Active Problems:   COPD (chronic obstructive pulmonary disease) (H[facility])   Hypertension   Insulin-requiring or dependent type II diabetes mellitus (H[facility])   CAD (coronary artery disease)   Cirrhosis of liver ([facility])   Chronic CHF (congestive heart failure) ([place])   CAP (community acquired pneumonia)   Pulmonary embolism ([place])   1. Acute right lower lobe PE: CT chest 12/28 showed right lower lobe segmental and subsegmental PE. This seems to have occurred despite therapeutic or even supratherapeutic Coumadin levels since 12/15/16. Case was discussed by EDP with on call hematologist who advised switching over to full dose Lovenox. Formerly consulted hematologist on 12/28 to guide anticoagulation treatment. 2. Chronic systolic CHF/s/p AICD &PPM per patients report: 2-D echo 12/16/16 shows LVEF 20-25  percent and diffuse hypokinesis. Remains mildly volume overloaded as suggested by trace ankle edema but no JVD. Continue home dose of Lasix 160 mg daily. Continue nitrates. 3. COPD/chronic respiratory failure on home oxygen 2 L/m at bedtime: No clinical exacerbation. 4. Chronic atrial fibrillation: Continue carvedilol and sotalol. Anticoagulation discussion as above. Currently on full dose Lovenox. 5. Lingualar PNA: Continue levofloxacin. 6. Type II DM/IDDM: Reasonable inpatient control. 7. Left lower extremity venous stasis ulcers: Wound care consultation appreciated and management per them. 8. Essential hypertension: Controlled. 9. Thrombocytopenia: Unclear etiology. Follow CBC   DVT prophylaxis: Lovenox full dose Code Status: Full Family Communication: None at bedside Disposition Plan: DC home when medically stable   Consultants:   Hematology  Procedures:   None  Antimicrobials:   Lovenox    Subjective: Feels better. Improved dyspnea. No cough or chest pain. Some right upper back pain.  Objective:  Vitals:   12/22/16 0300 12/22/16 0415 12/22/16 0459 12/22/16 1242  BP: 131/76 115/68 127/70 104/64  Pulse: 73 71 71 63  Resp:   18 18  Temp:   97.7 F (36.5 C) 97.5 F (36.4 C)  TempSrc:   Oral Oral  SpO2: 100% 98% 100% 100%  Weight:   86.1 kg (189 lb 14.4 oz)   Height:   5\' 9"  (1.753 m)     Intake/Output Summary (Last 24 hours) at 12/22/16 1520 Last data filed at 12/22/16 1100  Gross per 24 hour  Intake              480 ml  Output                0 ml  Net  480 ml   Filed Weights   12/21/16 1935 12/22/16 0459  Weight: 88.5 kg (195 lb) 86.1 kg (189 lb 14.4 oz)    Examination:  General exam: Pleasant elderly male sitting up comfortably at edge of bed this morning. Respiratory system: Occasional bibasilar crackles but otherwise clear to auscultation. Respiratory effort normal. Cardiovascular system: S1 & S2 heard, RRR. No JVD, murmurs, rubs,  gallops or clicks. Trace pedal edema. Telemetry: Sinus rhythm/on demand atrial pacing. Gastrointestinal system: Abdomen is nondistended, soft and nontender. No organomegaly or masses felt. Normal bowel sounds heard. Central nervous system: Alert and oriented. No focal neurological deficits. Extremities: Symmetric 5 x 5 power. Left leg dressing clean and dry. Skin: No rashes, lesions or ulcers Psychiatry: Judgement and insight appear normal. Mood & affect appropriate.     Data Reviewed: I have personally reviewed following labs and imaging studies  CBC:  Recent Labs Lab 12/16/16 0523 12/17/16 0836 12/18/16 0341 12/19/16 0424 12/21/16 1947 12/22/16 0502  WBC 13.0* 9.1 8.4 6.5 8.4 9.0  NEUTROABS 8.4*  --   --   --   --  5.7  HGB 14.0 13.2 12.5* 13.0 14.0 13.5  HCT 43.2 40.4 38.4* 39.7 42.9 42.2  MCV 85.7 85.1 85.0 85.6 85.0 87.0  PLT 149* 142* 116* 123* 152 [id number]*   Basic Metabolic Panel:  Recent Labs Lab 12/17/16 0523 12/18/16 0341 12/19/16 0424 12/21/16 1947 12/22/16 0502  NA 136 133* 133* 134* 133*  K 3.6 4.1 4.5 4.5 3.7  CL 102 98* 98* 99* 97*  CO2 27 27 31 24 28   GLUCOSE 72 184* 160* 223* 157*  BUN 20 25* 24* 28* 25*  CREATININE 1.27* 1.33* 1.23 1.24 1.08  CALCIUM 8.5* 8.6* 8.8* 9.4 8.9  MG  --  2.1  --   --   --    GFR: Estimated Creatinine Clearance: 66.3 mL/min (by C-G formula based on SCr of 1.08 mg/dL). Liver Function Tests:  Recent Labs Lab 12/17/16 0523 12/22/16 0502  AST 25 44*  ALT 17 35  ALKPHOS 65 84  BILITOT 1.2 1.1  PROT 5.8* 6.6  ALBUMIN 3.0* 3.1*   No results for input(s): LIPASE, AMYLASE in the last 168 hours. No results for input(s): AMMONIA in the last 168 hours. Coagulation Profile:  Recent Labs Lab 12/17/16 0836 12/18/16 0341 12/19/16 0424 12/21/16 1947 12/22/16 1033  INR 3.82 2.78 2.60 3.31 3.34   Cardiac Enzymes:  Recent Labs Lab 12/22/16 0502 12/22/16 1033  TROPONINI <0.03 <0.03   BNP (last 3 results) No  results for input(s): PROBNP in the last 8760 hours. HbA1C: No results for input(s): HGBA1C in the last 72 hours. CBG:  Recent Labs Lab 12/18/16 0731 12/18/16 1108 12/18/16 1709 12/18/16 2141 12/19/16 0820  GLUCAP 152* 130* 150* 190* 167*   Lipid Profile: No results for input(s): CHOL, HDL, LDLCALC, TRIG, CHOLHDL, LDLDIRECT in the last 72 hours. Thyroid Function Tests: No results for input(s): TSH, T4TOTAL, FREET4, T3FREE, THYROIDAB in the last 72 hours. Anemia Panel: No results for input(s): VITAMINB12, FOLATE, FERRITIN, TIBC, IRON, RETICCTPCT in the last 72 hours.  Sepsis Labs:  Recent Labs Lab 12/15/16 2339 12/17/16 0523 12/19/16 0424  PROCALCITON 0.11 0.14 0.10    No results found for this or any previous visit (from the past 240 hour(s)).       Radiology Studies: Dg Chest 2 View  Result Date: 12/21/2016 CLINICAL DATA:  Shortness of breath and cough for 1 week. Recent hospitalization and discharge without reported improvement  symptoms. EXAM: CHEST  2 VIEW COMPARISON:  Radiographs and CT 12/15/2016 FINDINGS: Patient is post median sternotomy. Dual lead left-sided pacemaker remains in place. The heart size and mediastinal contours are unchanged with mild cardiomegaly. Coronary stent versus calcifications. No pulmonary edema. Small right pleural effusion appears similar to prior exams. Lingular opacity on chest CT not well visualized radiographically. No pneumothorax. Stable degenerative change in the spine. IMPRESSION: Cardiomegaly and small right pleural effusion, unchanged from prior exams. No new or acute abnormality. Lingular opacity on prior chest CT not well visualized radiographically. Electronically Signed   By: Jeb Levering M.D.   On: 12/21/2016 21:15   Ct Angio Chest Pe W Or Wo Contrast  Result Date: 12/22/2016 CLINICAL DATA:  Dyspnea x1 week. Possible pulmonary embolus versus artifact 1 week ago on prior CT. EXAM: CT ANGIOGRAPHY CHEST WITH CONTRAST  TECHNIQUE: Multidetector CT imaging of the chest was performed using the standard protocol during bolus administration of intravenous contrast. Multiplanar CT image reconstructions and MIPs were obtained to evaluate the vascular anatomy. CONTRAST:  80 cc of Isovue 370 IV COMPARISON:  12/15/2016 CT FINDINGS: Cardiovascular: Stable cardiomegaly with dilatation the right atrium and retrograde flow of contrast is again noted from the right atrium into the IVC compatible with right heart failure. Coronary arteriosclerosis is again noted of the left main, lad, circumflex and RCA. No pericardial effusion. AICD device is again noted with leads projecting into the right atrium and right ventricle. Moderate atherosclerosis of the thoracic aorta. No aneurysmal dilatation. Once again there are filling defects to the right lower lobe segmental and subsegmental pulmonary arteries in keeping with pulmonary emboli, in similar distribution as noted on prior study, series 5 image 166 through 200. Unfortunately the RV/LV ratio cannot be adequately determined due to lack of contrast within the left ventricle due to timing of the contrast bolus. Mediastinum/Nodes: No mediastinal nor hilar lymphadenopathy. The esophagus is grossly unremarkable. No mediastinal fluid collection. No thyroid nodules noted. Lungs/Pleura: There is mild upper lobe predominant centrilobular emphysema with stable small right pleural effusion and associated mild compressive atelectasis in the right lower lobe. Minimal left lingular airspace opacity may reflect atelectasis or minimal pneumonia. This is without significant change. Upper Abdomen: Cirrhotic appearing liver with small volume ascites noted in the right upper quadrant. 19 mm low-density nodule of the left adrenal gland may represent a small adenoma. Musculoskeletal: No acute osseous abnormality. Median sternotomy sutures are noted. Small osteophytes along the lower thoracic spine. Review of the MIP  images confirms the above findings. IMPRESSION: Similar filling defect noted within segmental and subsegmental pulmonary arteries of the right lower lobe consistent with pulmonary embolus. Unfortunately due to timing of contrast, the RV/ LV ratio cannot be adequately assessed. No significant convex bowing of the septum towards the left ventricle is seen however. Critical Value/emergent results were called by telephone at the time of interpretation on 12/22/2016 at 1:37 am to Dr. Joseph Berkshire , who verbally acknowledged these results. Minimal lingular airspace opacity may reflect small focus of pneumonia not significantly changed. Stable small right pleural effusion with compressive atelectasis and centrilobular emphysema. Moderate cardiomegaly with coronary arteriosclerosis. Right cardiac dysfunction noted with reflux of contrast into the IVC and hepatic veins. Aortic atherosclerosis. Electronically Signed   By: Ashley Royalty M.D.   On: 12/22/2016 01:38        Scheduled Meds: . carvedilol  3.125 mg Oral BID WC  . collagenase   Topical Daily  . enoxaparin (LOVENOX) injection  85  mg Subcutaneous Q12H  . furosemide  160 mg Oral Daily  . insulin glargine  15 Units Subcutaneous QHS  . isosorbide mononitrate  10 mg Oral Q2000  . levofloxacin  500 mg Oral Daily  . potassium chloride SA  20 mEq Oral Daily  . simvastatin  40 mg Oral q1800  . sotalol  80 mg Oral BID   Continuous Infusions:   LOS: 0 days       [facility], MD [facility] Pager [phone]  If 7PM-7AM, please contact night-coverage [email or web address] Password [email or web address] 12/22/2016, 3:20 PM

## 2016-12-23 DIAGNOSIS — Z951 Presence of aortocoronary bypass graft: Secondary | ICD-10-CM | POA: Diagnosis not present

## 2016-12-23 DIAGNOSIS — I110 Hypertensive heart disease with heart failure: Secondary | ICD-10-CM | POA: Diagnosis present

## 2016-12-23 DIAGNOSIS — J189 Pneumonia, unspecified organism: Secondary | ICD-10-CM | POA: Diagnosis present

## 2016-12-23 DIAGNOSIS — E1165 Type 2 diabetes mellitus with hyperglycemia: Secondary | ICD-10-CM | POA: Diagnosis present

## 2016-12-23 DIAGNOSIS — I472 Ventricular tachycardia: Secondary | ICD-10-CM | POA: Diagnosis present

## 2016-12-23 DIAGNOSIS — I2699 Other pulmonary embolism without acute cor pulmonale: Secondary | ICD-10-CM | POA: Diagnosis present

## 2016-12-23 DIAGNOSIS — E876 Hypokalemia: Secondary | ICD-10-CM | POA: Diagnosis present

## 2016-12-23 DIAGNOSIS — D696 Thrombocytopenia, unspecified: Secondary | ICD-10-CM | POA: Diagnosis present

## 2016-12-23 DIAGNOSIS — I480 Paroxysmal atrial fibrillation: Secondary | ICD-10-CM | POA: Diagnosis present

## 2016-12-23 DIAGNOSIS — R0602 Shortness of breath: Secondary | ICD-10-CM | POA: Diagnosis present

## 2016-12-23 DIAGNOSIS — I83029 Varicose veins of left lower extremity with ulcer of unspecified site: Secondary | ICD-10-CM | POA: Diagnosis present

## 2016-12-23 DIAGNOSIS — I5042 Chronic combined systolic (congestive) and diastolic (congestive) heart failure: Secondary | ICD-10-CM | POA: Diagnosis present

## 2016-12-23 DIAGNOSIS — Z9981 Dependence on supplemental oxygen: Secondary | ICD-10-CM | POA: Diagnosis not present

## 2016-12-23 DIAGNOSIS — Z79899 Other long term (current) drug therapy: Secondary | ICD-10-CM | POA: Diagnosis not present

## 2016-12-23 DIAGNOSIS — Z8612 Personal history of poliomyelitis: Secondary | ICD-10-CM | POA: Diagnosis not present

## 2016-12-23 DIAGNOSIS — L97929 Non-pressure chronic ulcer of unspecified part of left lower leg with unspecified severity: Secondary | ICD-10-CM | POA: Diagnosis present

## 2016-12-23 DIAGNOSIS — E785 Hyperlipidemia, unspecified: Secondary | ICD-10-CM | POA: Diagnosis present

## 2016-12-23 DIAGNOSIS — I25118 Atherosclerotic heart disease of native coronary artery with other forms of angina pectoris: Secondary | ICD-10-CM

## 2016-12-23 DIAGNOSIS — E119 Type 2 diabetes mellitus without complications: Secondary | ICD-10-CM | POA: Diagnosis not present

## 2016-12-23 DIAGNOSIS — J440 Chronic obstructive pulmonary disease with acute lower respiratory infection: Secondary | ICD-10-CM | POA: Diagnosis present

## 2016-12-23 DIAGNOSIS — K7460 Unspecified cirrhosis of liver: Secondary | ICD-10-CM | POA: Diagnosis present

## 2016-12-23 DIAGNOSIS — Z95810 Presence of automatic (implantable) cardiac defibrillator: Secondary | ICD-10-CM | POA: Diagnosis not present

## 2016-12-23 DIAGNOSIS — Z87891 Personal history of nicotine dependence: Secondary | ICD-10-CM | POA: Diagnosis not present

## 2016-12-23 DIAGNOSIS — J9610 Chronic respiratory failure, unspecified whether with hypoxia or hypercapnia: Secondary | ICD-10-CM | POA: Diagnosis present

## 2016-12-23 DIAGNOSIS — Z794 Long term (current) use of insulin: Secondary | ICD-10-CM | POA: Diagnosis not present

## 2016-12-23 DIAGNOSIS — I255 Ischemic cardiomyopathy: Secondary | ICD-10-CM | POA: Diagnosis present

## 2016-12-23 LAB — BASIC METABOLIC PANEL
Anion gap: 7 (ref 5–15)
BUN: 18 mg/dL (ref 6–20)
CALCIUM: 8.7 mg/dL — AB (ref 8.9–10.3)
CO2: 29 mmol/L (ref 22–32)
CREATININE: 0.98 mg/dL (ref 0.61–1.24)
Chloride: 99 mmol/L — ABNORMAL LOW (ref 101–111)
GFR calc Af Amer: >60 mL/min (ref >60)
GLUCOSE: 202 mg/dL — AB (ref 65–99)
Potassium: 3.9 mmol/L (ref 3.5–5.1)
SODIUM: 135 mmol/L (ref 135–145)

## 2016-12-23 LAB — CBC
HCT: 39.5 % (ref 39.0–52.0)
Hemoglobin: 12.8 g/dL — ABNORMAL LOW (ref 13.0–17.0)
MCH: 27.5 pg (ref 26.0–34.0)
MCHC: 32.4 g/dL (ref 30.0–36.0)
MCV: 84.8 fL (ref 78.0–100.0)
PLATELETS: 113 10*3/uL — AB (ref 150–400)
RBC: 4.66 MIL/uL (ref 4.22–5.81)
RDW: 15.5 % (ref 11.5–15.5)
WBC: 6.5 10*3/uL (ref 4.0–10.5)

## 2016-12-23 LAB — MAGNESIUM: MAGNESIUM: 1.9 mg/dL (ref 1.7–2.4)

## 2016-12-23 LAB — PROTIME-INR
INR: 2.7
PROTHROMBIN TIME: 29.2 s — AB (ref 11.4–15.2)

## 2016-12-23 LAB — GLUCOSE, CAPILLARY: Glucose-Capillary: 304 mg/dL — ABNORMAL HIGH (ref 65–99)

## 2016-12-23 MED ORDER — CARVEDILOL 6.25 MG PO TABS
6.2500 mg | ORAL_TABLET | Freq: Two times a day (BID) | ORAL | Status: DC
Start: 2016-12-24 — End: 2016-12-24
  Administered 2016-12-24: 6.25 mg via ORAL
  Filled 2016-12-23: qty 1

## 2016-12-23 MED ORDER — SPIRONOLACTONE 25 MG PO TABS
12.5000 mg | ORAL_TABLET | Freq: Every day | ORAL | Status: DC
  Administered 2016-12-24: 12.5 mg via ORAL
  Filled 2016-12-23: qty 1

## 2016-12-23 NOTE — Progress Notes (Signed)
Results for Edward Meyer, Edward Meyer (MRN [id number]) as of 12/23/2016 08:27  Ref. Range 12/18/2016 11:08 12/18/2016 17:09 12/18/2016 21:41 12/19/2016 08:20 12/22/2016 22:36  Glucose-Capillary Latest Ref Range: 65 - 99 mg/dL 130 (H) 150 (H) 190 (H) 167 (H) 278 (H)  Consider adding Novolog SENSITIVE correction scale TID & HS while in the hospital along with the Lantus. Harvel Ricks RN BSN CDE

## 2016-12-23 NOTE — Discharge Instructions (Addendum)
Information on my medicine - XARELTO (rivaroxaban)  This medication education was reviewed with me or my healthcare representative as part of my discharge preparation.  The pharmacist that spoke with me during my hospital stay was:  Dareen Piano, [place]? Xarelto was prescribed to treat blood clots that may have been found in the veins of your legs (deep vein thrombosis) or in your lungs (pulmonary embolism) and to reduce the risk of them occurring again.  What do you need to know about Xarelto? The starting dose is one 15 mg tablet taken TWICE daily with food for the FIRST 21 DAYS then on 01/14/16 the dose is changed to one 20 mg tablet taken ONCE A DAY with your evening meal.  DO NOT stop taking Xarelto without talking to the health care provider who prescribed the medication.  Refill your prescription for 20 mg tablets before you run out.  After discharge, you should have regular check-up appointments with your healthcare provider that is prescribing your Xarelto.  In the future your dose may need to be changed if your kidney function changes by a significant amount.  What do you do if you miss a dose? If you are taking Xarelto TWICE DAILY and you miss a dose, take it as soon as you remember. You may take two 15 mg tablets (total 30 mg) at the same time then resume your regularly scheduled 15 mg twice daily the next day.  If you are taking Xarelto ONCE DAILY and you miss a dose, take it as soon as you remember on the same day then continue your regularly scheduled once daily regimen the next day. Do not take two doses of Xarelto at the same time.   Important Safety Information Xarelto is a blood thinner medicine that can cause bleeding. You should call your healthcare provider right away if you experience any of the following: ? Bleeding from an injury or your nose that does not stop. ? Unusual colored urine (red or dark brown) or unusual  colored stools (red or black). ? Unusual bruising for unknown reasons. ? A serious fall or if you hit your head (even if there is no bleeding).  Some medicines may interact with Xarelto and might increase your risk of bleeding while on Xarelto. To help avoid this, consult your healthcare provider or pharmacist prior to using any new prescription or non-prescription medications, including herbals, vitamins, non-steroidal anti-inflammatory drugs (NSAIDs) and supplements.  This website has more information on Xarelto: [email or web address]-benson.[email or web address]   Pulmonary Embolism A pulmonary embolism (PE) is a sudden blockage or decrease of blood flow in one lung or both lungs. Most blockages come from a blood clot that travels from the legs or the pelvis to the lungs. PE is a dangerous and potentially life-threatening condition if it is not treated right away. What are the causes? A pulmonary embolism occurs most commonly when a blood clot travels from one of your veins to your lungs. Rarely, PE is caused by air, fat, amniotic fluid, or part of a tumor traveling through your veins to your lungs. What increases the risk? A PE is more likely to develop in:  People who smoke.  People who areolder, especially over 47 years of age.  People who are overweight (obese).  People who sit or lie still for a long time, such as during long-distance travel (over 4 hours), bed rest, hospitalization, or during recovery from certain medical conditions like a stroke.  People  who do not engage in much physical activity (sedentary lifestyle).  People who have chronic breathing disorders.  People whohave a personal or family history of blood clots or blood clotting disease.  People whohave peripheral vascular disease (PVD), diabetes, or some types of cancer.  People who haveheart disease, especially if the person had a recent heart attack or has congestive heart failure.  People who have neurological diseases that affect  the legs (leg paresis).  People who have had a traumatic injury, such as breaking a hip or leg.  People whohave recently had major or lengthy surgery, especially on the hip, knee, or abdomen.  People who have hada central line placed inside a large vein.  People who takemedicines that contain the hormone estrogen. These include birth control pills and hormone replacement therapy.  Pregnancy or during childbirth or the postpartum period. What are the signs or symptoms? The symptoms of a PE usually start suddenly and include:  Shortness of breath while active or at rest.  Coughing or coughing up blood or blood-tinged mucus.  Chest pain that is often worse with deep breaths.  Rapid or irregular heartbeat.  Feeling light-headed or dizzy.  Fainting.  Feelinganxious.  Sweating. There may also be pain and swelling in a leg if that is where the blood clot started. These symptoms may represent a serious problem that is an emergency. Do not wait to see if the symptoms will go away. Get medical help right away. Call your local emergency services (911 in the [place] Do not drive yourself to the hospital.  How is this diagnosed? Your health care provider will take a medical history and perform a physical exam. You may also have other tests, including:  Blood tests to assess the clotting properties of your blood, assess oxygen levels in your blood, and find blood clots.  Imaging tests, such as CT, ultrasound, MRI, X-ray, and other tests to see if you have clots anywhere in your body.  An electrocardiogram (ECG) to look for heart strain from blood clots in the lungs. How is this treated? The main goals of PE treatment are:  To stop a blood clot from growing larger.  To stop new blood clots from forming. The type of treatment that you receive depends on many factors, such as the cause of your PE, your risk for bleeding or developing more clots, and other medical conditions that you  have. Sometimes, a combination of treatments is necessary. This condition may be treated with:  Medicines, including newer oral blood thinners (anticoagulants), warfarin, low molecular weight heparins, thrombolytics, or heparins.  Wearing compression stockings or using different types of devices.  Surgery (rare) to remove the blood clot or to place a filter in your abdomen to stop the blood clot from traveling to your lungs. Treatments for a PE are often divided into immediate treatment, long-term treatment (up to 3 months after PE), and extended treatment (more than 3 months after PE). Your treatment may continue for several months. This is called maintenance therapy, and it is used to prevent the forming of new blood clots. You can work with your health care provider to choose the treatment program that is best for you. What are anticoagulants?  Anticoagulants are medicines that treat PEs. They can stop current blood clots from growing and stop new clots from forming. They cannot dissolve existing clots. Your body dissolves clots by itself over time. Anticoagulants are given by mouth, by injection, or through an IV tube. What are thrombolytics?  Thrombolytics are clot-dissolving medicines that are used to dissolve a PE. They carry a high risk of bleeding, so they tend to be used only in severe cases or if you have very low blood pressure. Follow these instructions at home: If you are taking a newer oral anticoagulant:  Take the medicine every single day at the same time each day.  Understand what foods and drugs interact with this medicine.  Understand that there are no regular blood tests required when using this medicine.  Understandthe side effects of this medicine, including excessive bruising or bleeding. Ask your health care provider or pharmacist about other possible side effects. If you are taking warfarin:  Understand how to take warfarin and know which foods can affect how  warfarin works in Veterinary surgeon.  Understand that it is dangerous to taketoo much or too little warfarin. Too much warfarin increases the risk of bleeding. Too little warfarin continues to allow the risk for blood clots.  Follow your PT and INR blood testing schedule. The PT and INR results allow your health care provider to adjust your dose of warfarin. It is very important that you have your PT and INR tested as often as told by your health care provider.  Avoid major changes in your diet, or tell your health care provider before you change your diet. Arrange a visit with a registered dietitian to answer your questions. Many foods, especially foods that are high in vitamin K, can interfere with warfarin and affect the PT and INR results. Eat a consistent amount of foods that are high in vitamin K, such as:  Spinach, kale, broccoli, cabbage, collard greens, turnip greens, Brussels sprouts, peas, cauliflower, seaweed, and parsley.  Beef liver and pork liver.  Green tea.  Soybean oil.  Tell your health care provider about any and all medicines, vitamins, and supplements that you take, including aspirin and other over-the-counter anti-inflammatory medicines. Be especially cautious with aspirin and anti-inflammatory medicines. Do not take those before you ask your health care provider if it is safe to do so. This is important because many medicines can interfere with warfarin and affect the PT and INR results.  Do not start or stop taking any over-the-counter or prescription medicine unless your health care provider or pharmacist tells you to do so. If you take warfarin, you will also need to do these things:  Hold pressure over cuts for longer than usual.  Tell your dentist and other health care providers that you are taking warfarin before you have any procedures in which bleeding may occur.  Avoid alcohol or drink very small amounts. Tell your health care provider if you change your alcohol  intake.  Do not use tobacco products, including cigarettes, chewing tobacco, and e-cigarettes. If you need help quitting, ask your health care provider.  Avoid contact sports. General instructions  Take over-the-counter and prescription medicines only as told by your health care provider. Anticoagulant medicines can have side effects, including easy bruising and difficulty stopping bleeding. If you are prescribed an anticoagulant, you will also need to do these things:  Hold pressure over cuts for longer than usual.  Tell your dentist and other health care providers that you are taking anticoagulants before you have any procedures in which bleeding may occur.  Avoid contact sports.  Wear a medical alert bracelet or carry a medical alert card that says you have had a PE.  Ask your health care provider how soon you can go back to your normal  activities. Stay active to prevent new blood clots from forming.  Make sure to exercise while traveling or when you have been sitting or standing for a long period of time. It is very important to exercise. Exercise your legs by walking or by tightening and relaxing your leg muscles often. Take frequent walks.  Wear compression stockings as told by your health care provider to help prevent more blood clots from forming.  Do not use tobacco products, including cigarettes, chewing tobacco, and e-cigarettes. If you need help quitting, ask your health care provider.  Keep all follow-up appointments with your health care provider. This is important. How is this prevented? Take these actions to decrease your risk of developing another PE:  Exercise regularly. For at least 30 minutes every day, engage in:  Activity that involves moving your arms and legs.  Activity that encourages good blood flow through your body by increasing your heart rate.  Exercise your arms and legs every hour during long-distance travel (over 4 hours). Drink plenty of water and  avoid drinking alcohol while traveling.  Avoid sitting or lying in bed for long periods of time without moving your legs.  Maintain a weight that is appropriate for your height. Ask your health care provider what weight is healthy for you.  If you are a woman who is over 30 years of age, avoid unnecessary use of medicines that contain estrogen. These include birth control pills.  Do not smoke, especially if you take estrogen medicines. If you need help quitting, ask your health care provider.  If you are at very high risk for PE, wear compression stockings.  If you recently had a PE, have regularly scheduled ultrasound testing on your legs to check for new blood clots. If you are hospitalized, prevention measures may include:  Early walking after surgery, as soon as your health care provider says that it is safe.  Receiving anticoagulants to prevent blood clots. If you cannot take anticoagulants, other options may be available, such as wearing compression stockings or using different types of devices. Get help right away if:  You have new or increased pain, swelling, or redness in an arm or leg.  You have numbness or tingling in an arm or leg.  You have shortness of breath while active or at rest.  You have chest pain.  You have a rapid or irregular heartbeat.  You feel light-headed or dizzy.  You cough up blood.  You notice blood in your vomit, bowel movement, or urine.  You have a fever. These symptoms may represent a serious problem that is an emergency. Do not wait to see if the symptoms will go away. Get medical help right away. Call your local emergency services (911 in the [place] Do not drive yourself to the hospital.  This information is not intended to replace advice given to you by your health care provider. Make sure you discuss any questions you have with your health care provider. Document Released: 12/09/2000 Document Revised: 05/19/2016 Document Reviewed:  04/08/2015 Elsevier Interactive Patient Education  2017 [employer].

## 2016-12-23 NOTE — Care Management Note (Addendum)
Case Management Note  Patient Details  Name: Edward Meyer MRN: [id number] Date of Birth: [birth year]  Subjective/Objective:     PE               Action/Plan: Discharge Planning: NCM spoke to pt and gave permission to speak to dtr, Angie [phone] . Pt active with [facility] for [facility] RN. Provided pt with Xarelto 30 day free trial card. Pt was dc on neb solution but did not have machine at home. Contacted [facility] DME rep for neb machine for home. Pt will stay with dtr until he medically stable to fly back to [place]. Pt sees Dr. Wendelyn Breslow, Cardologist in [place] and Dr Debbra Riding, PCP. Message sent to attending for resumption of care order for [facility] and neb machine. Has a RW.  Dr Derinda Sis # [phone] fax [phone]  Dr Debbra Riding # [phone], fax [phone]  Oxygen provider - [place] [phone] Stormy [employer] DME supplier and spoke to [place]. She will contact and fax paperwork to [facility] to temp supply oxygen for pt while in [place]. Pt is on home oxygen at 2L but did not bring oxygen with him on the trip. Did not obtain clearance from airline prior to trip.  Dtr's address - [place] [address], [place] [address]  12/24/2016 Spoke to [place], [facility] will deliver portable to room and speak with dtr about cost for oxygen at home.    Expected Discharge Date:  12/24/2016               Expected Discharge Plan:  [place]  In-House Referral:  NA  Discharge planning Services  CM Consult, Medication Assistance  Post Acute Care Choice:  Home Health, Resumption of Svcs/PTA Provider Choice offered to:  Patient  DME Arranged:  Nebulizer machine DME Agency:  [address] Arranged:  RN [facility] Agency:  [place]  Status of Service:  Completed, signed off  If discussed at [place] of Stay Meetings, dates discussed:    Additional Comments:  Erenest Rasher, RN 12/23/2016, 4:44 PM

## 2016-12-23 NOTE — Consult Note (Signed)
Cardiology Consult    Patient ID: Acea Hoiland MRN: [id number], DOB/AGE: 1941/06/09   Admit date: 12/21/2016 Date of Consult: 12/23/2016  Primary Physician: Pcp Not In System Reason for Consult: NSVT, CHF Primary Cardiologist: patient lives in [place], has a cardiologist there Requesting Provider: Dr. Algis Liming  Patient Profile    Mr. Spears is a 75 year old male with a past medical history of polio as a child, lymphoma, chronic systolic CHF, ischemic cardiomyopathy s/p ICD, PAF (on Coumadin) and remote CABG. Admitted on 12/21/16 with acute pulmonary embolism. Cardiology consulted for management of his ischemic cardiomyopathy.   History of Present Illness    Mr. Lawhead lives with his step son in [place], and he follows regularly with a Cardiologist there. He has known reduced EF, although he does not know what his last EF was.   He was admitted with an acute PE, his echo showed LV EF of 20-25%. He tells me that he knows he has a weak heart. Most days, his activity is minimal but he can still preform his ADL's without shortness of breath.   On telemetry he has had a few runs of NSVT and Cardiology was consulted.   Past Medical History   Past Medical History:  Diagnosis Date  . Cancer ([place])   . CHF (congestive heart failure) ([place])   . COPD (chronic obstructive pulmonary disease) ([place])   . Coronary artery disease   . Diabetes mellitus without complication ([place])   . Hyperlipidemia   . Hypertension     History reviewed. No pertinent surgical history.   Allergies  No Known Allergies    Inpatient Medications    . carvedilol  3.125 mg Oral BID WC  . collagenase   Topical Daily  . furosemide  160 mg Oral Daily  . insulin glargine  15 Units Subcutaneous QHS  . isosorbide mononitrate  10 mg Oral Q2000  . levofloxacin  500 mg Oral Daily  . potassium chloride SA  20 mEq Oral Daily  . rivaroxaban  15 mg Oral BID WC   Followed by  . [START ON 01/13/2017] rivaroxaban   20 mg Oral Q supper  . simvastatin  40 mg Oral q1800  . sotalol  80 mg Oral BID    Family History    Family History  Problem Relation Age of Onset  . Obesity Daughter     Social History    Social History   Social History  . Marital status: Single    Spouse name: N/A  . Number of children: N/A  . Years of education: N/A   Occupational History  . Not on file.   Social History Main Topics  . Smoking status: Former Research scientist (life sciences)  . Smokeless tobacco: Never Used  . Alcohol use No  . Drug use: No  . Sexual activity: Not on file   Other Topics Concern  . Not on file   Social History Narrative  . No narrative on file     Review of Systems    General:  No chills, fever, night sweats or weight changes.  Cardiovascular:  No chest pain, dyspnea on exertion, edema, orthopnea, palpitations, paroxysmal nocturnal dyspnea. Dermatological: No rash, lesions/masses Respiratory: No cough, dyspnea Urologic: No hematuria, dysuria Abdominal:   No nausea, vomiting, diarrhea, bright red blood per rectum, melena, or hematemesis Neurologic:  No visual changes, wkns, changes in mental status. All other systems reviewed and are otherwise negative except as noted above.  Physical Exam  Blood pressure (!) 104/59, pulse 66, temperature 98.3 F (36.8 C), temperature source Oral, resp. rate 18, height 5\' 9"  (1.753 m), weight 185 lb 6.4 oz (84.1 kg), SpO2 95 %.  General: Pleasant, ill appearing male  Psych: Normal affect. Neuro: Alert and oriented X 3. Moves all extremities spontaneously. HEENT: Normal  Neck: Supple without bruits. 6-7 cm JVD. Lungs:  Resp regular and unlabored, diminished in bases.  Heart: RRR no s3, s4, or murmurs. Abdomen: Soft, non-tender, non-distended, BS + x 4.  Extremities: 1+ lower extremity edema; No clubbing, cyanosis. DP/PT/Radials 2+ and equal bilaterally.  Labs    Troponin [facility] of Care Test)  Recent Labs  12/21/16 1959  TROPIPOC 0.01    Recent Labs   12/22/16 0502 12/22/16 1033 12/22/16 1613  TROPONINI <0.03 <0.03 0.06*   Lab Results  Component Value Date   WBC 6.5 12/23/2016   HGB 12.8 (L) 12/23/2016   HCT 39.5 12/23/2016   MCV 84.8 12/23/2016   PLT 113 (L) 12/23/2016    Recent Labs Lab 12/22/16 0502 12/23/16 0431  NA 133* 135  K 3.7 3.9  CL 97* 99*  CO2 28 29  BUN 25* 18  CREATININE 1.08 0.98  CALCIUM 8.9 8.7*  PROT 6.6  --   BILITOT 1.1  --   ALKPHOS 84  --   ALT 35  --   AST 44*  --   GLUCOSE 157* 202*   No results found for: CHOL, HDL, LDLCALC, TRIG No results found for: [facility]   Radiology Studies    Dg Chest 2 View  Result Date: 12/21/2016 CLINICAL DATA:  Shortness of breath and cough for 1 week. Recent hospitalization and discharge without reported improvement symptoms. EXAM: CHEST  2 VIEW COMPARISON:  Radiographs and CT 12/15/2016 FINDINGS: Patient is post median sternotomy. Dual lead left-sided pacemaker remains in place. The heart size and mediastinal contours are unchanged with mild cardiomegaly. Coronary stent versus calcifications. No pulmonary edema. Small right pleural effusion appears similar to prior exams. Lingular opacity on chest CT not well visualized radiographically. No pneumothorax. Stable degenerative change in the spine. IMPRESSION: Cardiomegaly and small right pleural effusion, unchanged from prior exams. No new or acute abnormality. Lingular opacity on prior chest CT not well visualized radiographically. Electronically Signed   By: Jeb Levering M.D.   On: 12/21/2016 21:15   Dg Chest 2 View  Result Date: 12/15/2016 CLINICAL DATA:  Productive cough with shortness of breath for 3 days. Pain in the neck, both shoulders and upper back. History of asthma, pacemaker and diabetes. EXAM: CHEST  2 VIEW COMPARISON:  None. FINDINGS: Left subclavian AICD leads are present within the right atrium and right ventricle. The heart size is normal status post median sternotomy. There is aortic  atherosclerosis. The lungs appear clear. There is no pleural effusion or pneumothorax. EKG snap overlies the upper right chest. Mild thoracic spine degenerative changes are noted. IMPRESSION: No acute cardiopulmonary process or explanation for the patient's symptoms identified. Electronically Signed   By: Richardean Sale M.D.   On: 12/15/2016 18:45   Ct Angio Chest Pe W Or Wo Contrast  Result Date: 12/22/2016 CLINICAL DATA:  Dyspnea x1 week. Possible pulmonary embolus versus artifact 1 week ago on prior CT. EXAM: CT ANGIOGRAPHY CHEST WITH CONTRAST TECHNIQUE: Multidetector CT imaging of the chest was performed using the standard protocol during bolus administration of intravenous contrast. Multiplanar CT image reconstructions and MIPs were obtained to evaluate the vascular anatomy. CONTRAST:  80 cc of  Isovue 370 IV COMPARISON:  12/15/2016 CT FINDINGS: Cardiovascular: Stable cardiomegaly with dilatation the right atrium and retrograde flow of contrast is again noted from the right atrium into the IVC compatible with right heart failure. Coronary arteriosclerosis is again noted of the left main, lad, circumflex and RCA. No pericardial effusion. AICD device is again noted with leads projecting into the right atrium and right ventricle. Moderate atherosclerosis of the thoracic aorta. No aneurysmal dilatation. Once again there are filling defects to the right lower lobe segmental and subsegmental pulmonary arteries in keeping with pulmonary emboli, in similar distribution as noted on prior study, series 5 image 166 through 200. Unfortunately the RV/LV ratio cannot be adequately determined due to lack of contrast within the left ventricle due to timing of the contrast bolus. Mediastinum/Nodes: No mediastinal nor hilar lymphadenopathy. The esophagus is grossly unremarkable. No mediastinal fluid collection. No thyroid nodules noted. Lungs/Pleura: There is mild upper lobe predominant centrilobular emphysema with stable  small right pleural effusion and associated mild compressive atelectasis in the right lower lobe. Minimal left lingular airspace opacity may reflect atelectasis or minimal pneumonia. This is without significant change. Upper Abdomen: Cirrhotic appearing liver with small volume ascites noted in the right upper quadrant. 19 mm low-density nodule of the left adrenal gland may represent a small adenoma. Musculoskeletal: No acute osseous abnormality. Median sternotomy sutures are noted. Small osteophytes along the lower thoracic spine. Review of the MIP images confirms the above findings. IMPRESSION: Similar filling defect noted within segmental and subsegmental pulmonary arteries of the right lower lobe consistent with pulmonary embolus. Unfortunately due to timing of contrast, the RV/ LV ratio cannot be adequately assessed. No significant convex bowing of the septum towards the left ventricle is seen however. Critical Value/emergent results were called by telephone at the time of interpretation on 12/22/2016 at 1:37 am to Dr. Joseph Berkshire , who verbally acknowledged these results. Minimal lingular airspace opacity may reflect small focus of pneumonia not significantly changed. Stable small right pleural effusion with compressive atelectasis and centrilobular emphysema. Moderate cardiomegaly with coronary arteriosclerosis. Right cardiac dysfunction noted with reflux of contrast into the IVC and hepatic veins. Aortic atherosclerosis. Electronically Signed   By: Ashley Royalty M.D.   On: 12/22/2016 01:38   Ct Angio Chest Pe W Or Wo Contrast  Result Date: 12/15/2016 CLINICAL DATA:  75 year old male with shortness of breath for several years. History of COPD, coronary artery disease, and CHF. EXAM: CT ANGIOGRAPHY CHEST WITH CONTRAST TECHNIQUE: Multidetector CT imaging of the chest was performed using the standard protocol during bolus administration of intravenous contrast. Multiplanar CT image reconstructions and  MIPs were obtained to evaluate the vascular anatomy. CONTRAST:  80 cc Isovue 370 COMPARISON:  Chest radiograph dated 12/15/2016 FINDINGS: Cardiovascular: There is moderate cardiomegaly with dilatation of the right atrium. There is retrograde flow of contrast from the right atrium into the IVC compatible with a degree of right cardiac dysfunction. Correlation with echocardiogram recommended. There is advanced coronary vascular calcification involving the left main, LAD, left circumflex artery, and to lesser degree RCA. There is no pericardial effusion. Left pectoral dual lead AICD device with leads in the right atrium and right ventricle. There is moderate atherosclerotic calcification of the thoracic aorta. There is no aneurysmal dilatation. Evaluation of the pulmonary arteries is limited due to suboptimal opacification of the peripheral branches. There is however decreased opacification of the segmental branch point and subsegmental branches of the right lower lobe pulmonary artery (series 5 image 164- 195)  most suspicious for pulmonary artery embolus. Mediastinum/Nodes: There is no hilar or mediastinal adenopathy. The esophagus is grossly unremarkable. No mediastinal fluid collection. No thyroid nodules identified. Lungs/Pleura: Mild centrilobular emphysema. There is diffuse chronic interstitial coarsening and hazy ground-glass density throughout the lungs. No interstitial edema. There is a focal area of consolidation in the left infrahilar region within the lingula concerning for pneumonia. A focal pulmonary infarct is less likely. There is a small right pleural effusion with associated mild compressive atelectasis of the right lower lobe. Pneumonia is less likely. There is no pneumothorax. The central airways are patent. Upper Abdomen: There is irregularity of the hepatic contour compatible with cirrhosis. Small perihepatic free fluid noted. The visualized upper abdomen is otherwise unremarkable.  Musculoskeletal: Degenerative changes of the spine with multilevel osteophyte. No acute fracture. Median sternotomy wires noted and appear intact. Review of the MIP images confirms the above findings. IMPRESSION: Non opacification of the segmental branch point and subsegmental branches of the right lower lobe. Although there is suboptimal opacification of the distal pulmonary artery branches, findings are suspicious for PE. Focal left infrahilar/lingular consolidative change concerning for pneumonia. The pulmonary infarct is less likely. Clinical correlation is recommended. Moderate cardiomegaly with multivessel coronary vascular disease and evidence of right cardiac dysfunction. Correlation with echocardiogram recommended. Small right pleural effusion. Cirrhosis with partially visualized small perihepatic ascites. These results were called by telephone at the time of interpretation on 12/15/2016 at 10:13 pm to Dr. Veryl Speak , who verbally acknowledged these results. Electronically Signed   By: Anner Crete M.D.   On: 12/15/2016 22:21   Ct Cervical Spine Wo Contrast  Result Date: 12/15/2016 CLINICAL DATA:  75 year old male with neck pain. EXAM: CT CERVICAL SPINE WITHOUT CONTRAST TECHNIQUE: Multidetector CT imaging of the cervical spine was performed without intravenous contrast. Multiplanar CT image reconstructions were also generated. COMPARISON:  None. FINDINGS: Alignment: Normal. Skull base and vertebrae: No acute fracture. No primary bone lesion or focal pathologic process. Soft tissues and spinal canal: No prevertebral fluid or swelling. No visible canal hematoma. Disc levels: There is degenerative changes. Disc osteophyte complex with moderate narrowing of the left neural foramina at C5-C6. The remainder of the neural foramina as well as the central canal appear widely patent. Upper chest: Negative. Cardiac pacemaker wires partially visualized in the left chest wall. Other: None IMPRESSION: No  acute/ traumatic cervical spine pathology. Degenerative changes with disc osteophyte complex and moderate narrowing of the left neural foramina at C5-C6. Electronically Signed   By: Anner Crete M.D.   On: 12/15/2016 21:58    EKG & Cardiac Imaging    EKG: NSR, QTC .499  Echocardiogram: 12/16/16 Study Conclusions  - Left ventricle: The cavity size was normal. There was mild   concentric hypertrophy. Systolic function was severely reduced.   The estimated ejection fraction was in the range of 20% to 25%.   Diffuse hypokinesis. - Aortic valve: Valve mobility was restricted. There was mild   stenosis. There was mild regurgitation. Valve area (VTI): 1.02   cm^2. Valve area (Vmax): 0.97 cm^2. Valve area (Vmean): 0.95   cm^2. - Mitral valve: Calcified annulus. Transvalvular velocity was   within the normal range. There was no evidence for stenosis.   There was moderate regurgitation. - Left atrium: The atrium was moderately dilated. - Right ventricle: The cavity size was normal. Wall thickness was   normal. Systolic function was moderately reduced. - Tricuspid valve: There was mild-moderate regurgitation. - Pulmonary arteries: Systolic pressure was  severely increased. PA   peak pressure: 68 mm Hg (S).  Assessment & Plan    1. NSVT: In the setting of known ischemic cardiomyopathy and patient has a AICD. He is on sotalol,    2. Ischemic cardiomyopathy: Appears stable. Continue ACE-I, isosorbide, Coreg. Consider adding Spiro 12.5 mg daily  for mortality benefit.   3. History of CAD s/p CABG  4. Acute PE: Per primary team, he is now on Xarelto.   Signed, Arbutus Leas, NP 12/23/2016, 12:15 PM Pager: [phone]   Patient seen and examined. Agree with assessment and plan. Mr. Drace Gater is a 75 year old Caucasian male who is from [place] and is followed by cardiologist at home.  He has a history of known CAD status post remote CABG revascularization surgery.  (I do not know  the specifics).  He has a history of an ischemic cardiomyopathy and is status post implantable cardiac defibrillator.  He also has a history of PAF for which he had been on Coumadin therapy.  He was here visiting family.  He developed increasing shortness of breath which led to his hospitalization and he was found to have an acute pulmonary embolism.  He is now on Xarelto anticoagulation in place of Coumadin.  An echo Doppler study has revealed an EF of 20-25%.  He had aortic valve disease with mild AST/AR, moderate mitral annular calcification with moderate MR, moderate LA dilatation, and significant pulmonary hypertension with an estimated PA pressure at 68 mm.  He has been on chronic sotalol therapy and his ECG  has shown sinus rhythm with a QTc interval of 499 ms. .  His has had ventricular ectopy with documented salvo of nonsustained ventricular tachycardia.  Denies any episodes of recent chest pain.  On exam, his blood pressure is low normal at 12[birth year]29/1955.  His rhythm is regular in the 60s.  JVD approximated 78 cm.  He had decreased breath sounds at his bases bilaterally.  Rhythm was regular.  There was 1/6 systolic murmur.  I did not appreciate an aortic insufficiency murmur.  Abdomen was soft with positive bowel sounds.  He had had at least 1+ bilateral lower extremity pretibial edema.  Her tori is notable for normal renal function.  There is minimal AST elevation.  He is mildly thrombocytopenic with a platelet count of 113,000.  His cardiac medications have included furosemide 160 mg daily, isosorbide 10 mg, sotalol 80 mg twice a day, carvedilol 3.125 mg twice a day, and simvastatin 40 mg.  At present, I would attempt to slightly titrate carvedilol to 6.25 mg twice a day.  He may also benefit from addition of spironolactone with his markedly reduced LV function and I will initiate this at 12.5 mg daily with potential plan to increase to twice a day if blood pressure allows. Consider adding ACE-I or ARB and  in the future, if his blood pressure allows he may also be a candidate for entresto.  We will follow the patient with you.  Ultimately, the patient's records will need to be sent back to [place] to his primary cardiologist.    Troy Sine, MD, [facility] 12/23/2016 3:24 PM

## 2016-12-23 NOTE — Evaluation (Signed)
Physical Therapy Evaluation Patient Details Name: Edward Meyer MRN: [id number] DOB: July 08, 1941 Today's Date: 12/23/2016   History of Present Illness  Pt is a 75 y.o. male who was recently D/C from [facility] with pneumonia. Pt D/C to his daughter's home and returned with worsening shortness of breath and chest pain. A CT angiogram was done which showed that he had a pulmonary embolism. PMH: coronary artery disease, chronic CHF, COPD, insulin-dependent diabetes mellitus, hypertension and cirrhosis of liver.  Clinical Impression  Pt currently with functional limitations due to the deficits listed below. Able to ambulate 155 ft with rw, SpO2 staying between 94% - 96% on RA. Pt is planning to return to his daughter's home upon D/C. Pt will benefit from skilled PT to increase their independence and safety with mobility to allow discharge to the venue listed below.       Follow Up Recommendations No PT follow up;Supervision for mobility/OOB    Equipment Recommendations  None recommended by PT    Recommendations for Other Services       Precautions / Restrictions Precautions Precautions: Fall Restrictions Weight Bearing Restrictions: No      Mobility  Bed Mobility Overal bed mobility: Needs Assistance Bed Mobility: Supine to Sit     Supine to sit: Supervision     General bed mobility comments: using rail to assist to sitting EOB  Transfers Overall transfer level: Needs assistance Equipment used: Rolling walker (2 wheeled) Transfers: Sit to/from Stand Sit to Stand: Supervision         General transfer comment: supervision for safety  Ambulation/Gait Ambulation/Gait assistance: Min guard Ambulation Distance (Feet): 155 Feet Assistive device: Rolling walker (2 wheeled) Gait Pattern/deviations: Step-through pattern;Decreased step length - right;Decreased step length - left (mild flexed trunk posture) Gait velocity: decreased   General Gait Details: Occasional cues to stay inside  rw, [id number]% on RA.   Stairs            Wheelchair Mobility    Modified Rankin (Stroke Patients Only)       Balance Overall balance assessment: Needs assistance Sitting-balance support: No upper extremity supported Sitting balance-Leahy Scale: Good     Standing balance support: Bilateral upper extremity supported Standing balance-Leahy Scale: Poor Standing balance comment: using rw                             Pertinent Vitals/Pain Pain Assessment: No/denies pain    Home Living Family/patient expects to be discharged to:: Private residence Living Arrangements: Children Available Help at Discharge: Family;Available 24 hours/day Type of Home: House Home Access: Stairs to enter Entrance Stairs-Rails: Psychiatric nurse of Steps: 1 Home Layout: Two level Home Equipment: Walker - 2 wheels Additional Comments: Pt is from [place] and is staying with daughter here in [place]. Plan is to D/C to his daughter's homes with family support.     Prior Function Level of Independence: Independent with assistive device(s)         Comments: Following recent D/C from [facility], pt was using rw for ambulation. Typically he lives with his son in [place]. Pt reports being independent with his mobility and ADLs but son does cooking/cleaning/shopping. Pt admits to max ambulation of 100 ft PTA.      Hand Dominance        Extremity/Trunk Assessment   Upper Extremity Assessment Upper Extremity Assessment: Generalized weakness    Lower Extremity Assessment Lower Extremity Assessment: Generalized weakness    Cervical /  Trunk Assessment Cervical / Trunk Assessment: Kyphotic  Communication   Communication: No difficulties  Cognition Arousal/Alertness: Awake/alert Behavior During Therapy: WFL for tasks assessed/performed Overall Cognitive Status: Within Functional Limits for tasks assessed                      General Comments       Exercises     Assessment/Plan    PT Assessment Patient needs continued PT services  PT Problem List Decreased strength;Decreased range of motion;Decreased activity tolerance;Decreased balance;Decreased mobility          PT Treatment Interventions DME instruction;Gait training;Stair training;Functional mobility training;Therapeutic activities;Therapeutic exercise;Patient/family education    PT Goals (Current goals can be found in the Care Plan section)  Acute Rehab PT Goals Patient Stated Goal: Continue to get stronger, get out of hospital PT Goal Formulation: With patient Time For Goal Achievement: 01/06/17 Potential to Achieve Goals: Good    Frequency Min 3X/week   Barriers to discharge        Co-evaluation               End of Session Equipment Utilized During Treatment: Gait belt Activity Tolerance: Patient tolerated treatment well Patient left: in chair;with call bell/phone within reach;with chair alarm set Nurse Communication: Mobility status    Functional Assessment Tool Used: clinical judgment Functional Limitation: Mobility: Walking and moving around Mobility: Walking and Moving Around Current Status [phone]): At least 20 percent but less than 40 percent impaired, limited or restricted Mobility: Walking and Moving Around Goal Status [phone]): At least 1 percent but less than 20 percent impaired, limited or restricted    Time: 1015-1044 PT Time Calculation (min) (ACUTE ONLY): 29 min   Charges:   PT Evaluation $PT Eval Moderate Complexity: 1 Procedure PT Treatments $Gait Training: 8-22 mins   PT G Codes:   PT G-Codes **NOT FOR INPATIENT CLASS** Functional Assessment Tool Used: clinical judgment Functional Limitation: Mobility: Walking and moving around Mobility: Walking and Moving Around Current Status [id number]): At least 20 percent but less than 40 percent impaired, limited or restricted Mobility: Walking and Moving Around Goal Status [phone]): At least  1 percent but less than 20 percent impaired, limited or restricted    Cassell Clement, PT, CSCS Pager [phone] Office [phone]  12/23/2016, 12:17 PM

## 2016-12-23 NOTE — Progress Notes (Addendum)
PROGRESS NOTE  Edward Meyer  [id number] DOB: 07-Nov-1941  DOA: 12/21/2016 PCP: Pcp Not In System Patient's PCP and primary cardiologist in [employer].  Brief Narrative:  75 year old male, resident of [place], visiting daughter in [place], [place], with PMH of chronic respiratory failure on home oxygen 2 L/m at bedtime, COPD, chronic atrial fibrillation on Coumadin anticoagulation prior to admission, chronic systolic CHF, CAD, DM 2/IDDM, HTN, recent hospitalization 12/15/16-12/19/16 for complaints of dyspnea, productive cough and upper back/left shoulder pain at which time he had a CTA chest suspicious for acute PE but lower extremity Dopplers were negative for DVT, case was discussed with radiologist and findings were felt to be related to artifact rather than acute PE and he was treated for possible left lower lobe pneumonia, now presented back in 48 hours later with worsening dyspnea without productive cough, fever or chills. Repeat CTA chest shows PE in the right side. Admitted for further evaluation and management.   Assessment & Plan:   Principal Problem:   PE (pulmonary thromboembolism) ([facility]) Active Problems:   COPD (chronic obstructive pulmonary disease) ([facility])   Hypertension   Insulin-requiring or dependent type II diabetes mellitus ([place])   Coronary artery disease of native heart with stable angina pectoris ([facility])   Cirrhosis of liver ([facility])   Chronic CHF (congestive heart failure) ([place])   CAP (community acquired pneumonia)   Pulmonary embolus ([place])   Chronic combined systolic and diastolic congestive heart failure ([facility])   1. Acute right lower lobe PE: CT chest 12/28 showed right lower lobe segmental and subsegmental PE. This seems to have occurred despite therapeutic or even supratherapeutic Coumadin levels since 12/15/16. Case was discussed by EDP with on call hematologist who advised switching over to full dose Lovenox. Hematology consultation 12/28 appreciated and patient has  been switched to Xarelto. 2. Chronic systolic CHF/ischemic cardiomyopathy/s/p AICD &PPM per patients report: 2-D echo 12/16/16 shows LVEF 20-25 percent and diffuse hypokinesis. Remains mildly volume overloaded as suggested by trace ankle edema but no JVD. Continue home dose of Lasix 160 mg daily. Continue nitrates. Cardiology input appreciated and have started low-dose Aldactone 12.5 MG daily. 3. COPD/chronic respiratory failure on home oxygen 2 L/m at bedtime: No clinical exacerbation. 4. Paroxysmal atrial fibrillation: Continue carvedilol and sotalol. Anticoagulation discussion as above. AC switched to Xarelto this admission 5. Lingualar PNA: Continue levofloxacin-completes 12/30. 6. Type II DM/IDDM: Reasonable inpatient control. 7. Left lower extremity venous stasis ulcers: Wound care consultation appreciated and management per them. 8. Essential hypertension: Controlled. 9. Thrombocytopenia: Unclear etiology. Stable. Follow CBC periodically.   10. CAD status post remote CABG: Stable.  11. NSVT: Cardiology input appreciated. Continue sotalol. Carvedilol increased to 6.25 MG twice a day. Monitor on telemetry.   DVT prophylaxis: Xarelto Code Status: Full Family Communication: None at bedside Disposition Plan: DC home when medically stable   Consultants:   Hematology  Cardiology  Procedures:   None  Antimicrobials:   Lovenox    Subjective: Feels better. Improved dyspnea and states that his breathing is back to baseline. No pain reported.   Objective:  Vitals:   12/23/16 0500 12/23/16 0602 12/23/16 0607 12/23/16 1300  BP:  (!) 104/59  108/66  Pulse:  66  67  Resp:  18  19  Temp:  98.3 F (36.8 C)  98.5 F (36.9 C)  TempSrc:  Oral  Oral  SpO2:  (!) 86% 95% 96%  Weight: 84.1 kg (185 lb 6.4 oz)     Height:  Intake/Output Summary (Last 24 hours) at 12/23/16 1819 Last data filed at 12/22/16 1828  Gross per 24 hour  Intake              240 ml  Output                 0 ml  Net              240 ml   Filed Weights   12/21/16 1935 12/22/16 0459 12/23/16 0500  Weight: 88.5 kg (195 lb) 86.1 kg (189 lb 14.4 oz) 84.1 kg (185 lb 6.4 oz)    Examination:  General exam: Pleasant elderly male sitting up comfortably at edge of bed this morning. Respiratory system: Occasional bibasilar crackles but otherwise clear to auscultation. Respiratory effort normal. Cardiovascular system: S1 & S2 heard, RRR. No JVD, murmurs, rubs, gallops or clicks. Trace pedal edema. Telemetry: Sinus rhythm/on demand atrial pacing.7 bt NSVT noted on 12/29.  Gastrointestinal system: Abdomen is nondistended, soft and nontender. No organomegaly or masses felt. Normal bowel sounds heard. Central nervous system: Alert and oriented. No focal neurological deficits. Extremities: Symmetric 5 x 5 power. Left leg dressing clean and dry. Skin: No rashes, lesions or ulcers Psychiatry: Judgement and insight appear normal. Mood & affect appropriate.     Data Reviewed: I have personally reviewed following labs and imaging studies  CBC:  Recent Labs Lab 12/18/16 0341 12/19/16 0424 12/21/16 1947 12/22/16 0502 12/23/16 0431  WBC 8.4 6.5 8.4 9.0 6.5  NEUTROABS  --   --   --  5.7  --   HGB 12.5* 13.0 14.0 13.5 12.8*  HCT 38.4* 39.7 42.9 42.2 39.5  MCV 85.0 85.6 85.0 87.0 84.8  PLT 116* 123* 152 135* 123456*   Basic Metabolic Panel:  Recent Labs Lab 12/18/16 0341 12/19/16 0424 12/21/16 1947 12/22/16 0502 12/23/16 0431  NA 133* 133* 134* 133* 135  K 4.1 4.5 4.5 3.7 3.9  CL 98* 98* 99* 97* 99*  CO2 27 31 24 28 29   GLUCOSE 184* 160* 223* 157* 202*  BUN 25* 24* 28* 25* 18  CREATININE 1.33* 1.23 1.24 1.08 0.98  CALCIUM 8.6* 8.8* 9.4 8.9 8.7*  MG 2.1  --   --   --  1.9   GFR: Estimated Creatinine Clearance: 67.1 mL/min (by C-G formula based on SCr of 0.98 mg/dL). Liver Function Tests:  Recent Labs Lab 12/17/16 0523 12/22/16 0502  AST 25 44*  ALT 17 35  ALKPHOS 65 84    BILITOT 1.2 1.1  PROT 5.8* 6.6  ALBUMIN 3.0* 3.1*   No results for input(s): LIPASE, AMYLASE in the last 168 hours. No results for input(s): AMMONIA in the last 168 hours. Coagulation Profile:  Recent Labs Lab 12/18/16 0341 12/19/16 0424 12/21/16 1947 12/22/16 1033 12/23/16 0431  INR 2.78 2.60 3.31 3.34 2.70   Cardiac Enzymes:  Recent Labs Lab 12/22/16 0502 12/22/16 1033 12/22/16 1613  TROPONINI <0.03 <0.03 0.06*   BNP (last 3 results) No results for input(s): PROBNP in the last 8760 hours. HbA1C: No results for input(s): HGBA1C in the last 72 hours. CBG:  Recent Labs Lab 12/18/16 1108 12/18/16 1709 12/18/16 2141 12/19/16 0820 12/22/16 2236  GLUCAP 130* 150* 190* 167* 278*   Lipid Profile: No results for input(s): CHOL, HDL, LDLCALC, TRIG, CHOLHDL, LDLDIRECT in the last 72 hours. Thyroid Function Tests: No results for input(s): TSH, T4TOTAL, FREET4, T3FREE, THYROIDAB in the last 72 hours. Anemia Panel: No results for input(s): VITAMINB12, FOLATE,  FERRITIN, TIBC, IRON, RETICCTPCT in the last 72 hours.  Sepsis Labs:  Recent Labs Lab 12/17/16 0523 12/19/16 0424  PROCALCITON 0.14 0.10    No results found for this or any previous visit (from the past 240 hour(s)).       Radiology Studies: Dg Chest 2 View  Result Date: 12/21/2016 CLINICAL DATA:  Shortness of breath and cough for 1 week. Recent hospitalization and discharge without reported improvement symptoms. EXAM: CHEST  2 VIEW COMPARISON:  Radiographs and CT 12/15/2016 FINDINGS: Patient is post median sternotomy. Dual lead left-sided pacemaker remains in place. The heart size and mediastinal contours are unchanged with mild cardiomegaly. Coronary stent versus calcifications. No pulmonary edema. Small right pleural effusion appears similar to prior exams. Lingular opacity on chest CT not well visualized radiographically. No pneumothorax. Stable degenerative change in the spine. IMPRESSION:  Cardiomegaly and small right pleural effusion, unchanged from prior exams. No new or acute abnormality. Lingular opacity on prior chest CT not well visualized radiographically. Electronically Signed   By: Jeb Levering M.D.   On: 12/21/2016 21:15   Ct Angio Chest Pe W Or Wo Contrast  Result Date: 12/22/2016 CLINICAL DATA:  Dyspnea x1 week. Possible pulmonary embolus versus artifact 1 week ago on prior CT. EXAM: CT ANGIOGRAPHY CHEST WITH CONTRAST TECHNIQUE: Multidetector CT imaging of the chest was performed using the standard protocol during bolus administration of intravenous contrast. Multiplanar CT image reconstructions and MIPs were obtained to evaluate the vascular anatomy. CONTRAST:  80 cc of Isovue 370 IV COMPARISON:  12/15/2016 CT FINDINGS: Cardiovascular: Stable cardiomegaly with dilatation the right atrium and retrograde flow of contrast is again noted from the right atrium into the IVC compatible with right heart failure. Coronary arteriosclerosis is again noted of the left main, lad, circumflex and RCA. No pericardial effusion. AICD device is again noted with leads projecting into the right atrium and right ventricle. Moderate atherosclerosis of the thoracic aorta. No aneurysmal dilatation. Once again there are filling defects to the right lower lobe segmental and subsegmental pulmonary arteries in keeping with pulmonary emboli, in similar distribution as noted on prior study, series 5 image 166 through 200. Unfortunately the RV/LV ratio cannot be adequately determined due to lack of contrast within the left ventricle due to timing of the contrast bolus. Mediastinum/Nodes: No mediastinal nor hilar lymphadenopathy. The esophagus is grossly unremarkable. No mediastinal fluid collection. No thyroid nodules noted. Lungs/Pleura: There is mild upper lobe predominant centrilobular emphysema with stable small right pleural effusion and associated mild compressive atelectasis in the right lower lobe.  Minimal left lingular airspace opacity may reflect atelectasis or minimal pneumonia. This is without significant change. Upper Abdomen: Cirrhotic appearing liver with small volume ascites noted in the right upper quadrant. 19 mm low-density nodule of the left adrenal gland may represent a small adenoma. Musculoskeletal: No acute osseous abnormality. Median sternotomy sutures are noted. Small osteophytes along the lower thoracic spine. Review of the MIP images confirms the above findings. IMPRESSION: Similar filling defect noted within segmental and subsegmental pulmonary arteries of the right lower lobe consistent with pulmonary embolus. Unfortunately due to timing of contrast, the RV/ LV ratio cannot be adequately assessed. No significant convex bowing of the septum towards the left ventricle is seen however. Critical Value/emergent results were called by telephone at the time of interpretation on 12/22/2016 at 1:37 am to Dr. Joseph Berkshire , who verbally acknowledged these results. Minimal lingular airspace opacity may reflect small focus of pneumonia not significantly changed. Stable small  right pleural effusion with compressive atelectasis and centrilobular emphysema. Moderate cardiomegaly with coronary arteriosclerosis. Right cardiac dysfunction noted with reflux of contrast into the IVC and hepatic veins. Aortic atherosclerosis. Electronically Signed   By: Ashley Royalty M.D.   On: 12/22/2016 01:38        Scheduled Meds: . carvedilol  3.125 mg Oral BID WC  . collagenase   Topical Daily  . furosemide  160 mg Oral Daily  . insulin glargine  15 Units Subcutaneous QHS  . isosorbide mononitrate  10 mg Oral Q2000  . levofloxacin  500 mg Oral Daily  . potassium chloride SA  20 mEq Oral Daily  . rivaroxaban  15 mg Oral BID WC   Followed by  . [START ON 01/13/2017] rivaroxaban  20 mg Oral Q supper  . simvastatin  40 mg Oral q1800  . sotalol  80 mg Oral BID   Continuous Infusions:   LOS: 0 days        [facility], MD [facility] Pager [phone]  If 7PM-7AM, please contact night-coverage [email or web address] Password [facility] 12/23/2016, 6:19 PM

## 2016-12-23 NOTE — Care Management Obs Status (Signed)
[facility] NOTIFICATION   Patient Details  Name: Edward Meyer MRN: [id number] Date of Birth: 02/17/1941   Medicare Observation Status Notification Given:  Yes    Erenest Rasher, RN 12/23/2016, 2:23 PM

## 2016-12-24 DIAGNOSIS — Z794 Long term (current) use of insulin: Secondary | ICD-10-CM

## 2016-12-24 LAB — GLUCOSE, CAPILLARY: Glucose-Capillary: 239 mg/dL — ABNORMAL HIGH (ref 65–99)

## 2016-12-24 LAB — BASIC METABOLIC PANEL
ANION GAP: 10 (ref 5–15)
BUN: 18 mg/dL (ref 6–20)
CO2: 26 mmol/L (ref 22–32)
Calcium: 8.7 mg/dL — ABNORMAL LOW (ref 8.9–10.3)
Chloride: 98 mmol/L — ABNORMAL LOW (ref 101–111)
Creatinine, Ser: 0.98 mg/dL (ref 0.61–1.24)
GFR calc Af Amer: >60 mL/min (ref >60)
GLUCOSE: 241 mg/dL — AB (ref 65–99)
POTASSIUM: 3.6 mmol/L (ref 3.5–5.1)
Sodium: 134 mmol/L — ABNORMAL LOW (ref 135–145)

## 2016-12-24 LAB — CBC
HEMATOCRIT: 38.5 % — AB (ref 39.0–52.0)
Hemoglobin: 12.3 g/dL — ABNORMAL LOW (ref 13.0–17.0)
MCH: 27.1 pg (ref 26.0–34.0)
MCHC: 31.9 g/dL (ref 30.0–36.0)
MCV: 84.8 fL (ref 78.0–100.0)
PLATELETS: 105 10*3/uL — AB (ref 150–400)
RBC: 4.54 MIL/uL (ref 4.22–5.81)
RDW: 15.4 % (ref 11.5–15.5)
WBC: 6.4 10*3/uL (ref 4.0–10.5)

## 2016-12-24 MED ORDER — RIVAROXABAN (XARELTO) VTE STARTER PACK (15 & 20 MG): ORAL_TABLET | ORAL | 0 refills | Status: AC

## 2016-12-24 MED ORDER — POTASSIUM CHLORIDE CRYS ER 20 MEQ PO TBCR
40.0000 meq | EXTENDED_RELEASE_TABLET | Freq: Every day | ORAL | Status: DC
  Administered 2016-12-24: 40 meq via ORAL
  Filled 2016-12-24: qty 2

## 2016-12-24 MED ORDER — COLLAGENASE 250 UNIT/GM EX OINT: TOPICAL_OINTMENT | Freq: Every day | CUTANEOUS | 0 refills | Status: AC

## 2016-12-24 MED ORDER — POTASSIUM CHLORIDE CRYS ER 10 MEQ PO TBCR
30.0000 meq | EXTENDED_RELEASE_TABLET | ORAL | Status: AC
Start: 2016-12-24 — End: 2016-12-24
  Administered 2016-12-24 (×2): 30 meq via ORAL
  Filled 2016-12-24 (×2): qty 1

## 2016-12-24 MED ORDER — SPIRONOLACTONE 25 MG PO TABS: 12.5000 mg | ORAL_TABLET | Freq: Every day | ORAL | 0 refills | Status: AC

## 2016-12-24 MED ORDER — CARVEDILOL 3.125 MG PO TABS: 6.2500 mg | ORAL_TABLET | Freq: Two times a day (BID) | ORAL | 0 refills | Status: AC

## 2016-12-24 NOTE — Progress Notes (Signed)
Patient Name: Edward Meyer Date of Encounter: 12/24/2016  Primary Cardiologist: patient lives in [place], has a cardiologist there  Hospital Problem List     Principal Problem:   PE (pulmonary thromboembolism) ([place]) Active Problems:   COPD (chronic obstructive pulmonary disease) ([place])   Hypertension   Insulin-requiring or dependent type II diabetes mellitus ([place])   Coronary artery disease of native heart with stable angina pectoris ([place])   Cirrhosis of liver ([place])   Chronic CHF (congestive heart failure) ([place])   CAP (community acquired pneumonia)   Pulmonary embolus ([place])   Chronic combined systolic and diastolic congestive heart failure ([place])   Pulmonary embolism ([place])     Subjective   Feeling much improved with less episodes of shortness of breath.  Had one run of monomorphic NSVT overnight, asymptomatic.  Inpatient Medications    Scheduled Meds: . carvedilol  6.25 mg Oral BID WC  . collagenase   Topical Daily  . furosemide  160 mg Oral Daily  . insulin glargine  15 Units Subcutaneous QHS  . isosorbide mononitrate  10 mg Oral Q2000  . levofloxacin  500 mg Oral Daily  . potassium chloride SA  40 mEq Oral Daily  . rivaroxaban  15 mg Oral BID WC   Followed by  . [START ON 01/13/2017] rivaroxaban  20 mg Oral Q supper  . simvastatin  40 mg Oral q1800  . sotalol  80 mg Oral BID  . spironolactone  12.5 mg Oral Daily   Continuous Infusions:  PRN Meds: acetaminophen **OR** acetaminophen, calcium carbonate, HYDROcodone-acetaminophen, ipratropium-albuterol, ondansetron **OR** ondansetron (ZOFRAN) IV   Vital Signs    Vitals:   12/23/16 1718 12/23/16 1720 12/23/16 2011 12/24/16 0321  BP:   (!) 94/50 (!) 99/40  Pulse:   71 71  Resp:   20 20  Temp:   97.8 F (36.6 C) 97.9 F (36.6 C)  TempSrc:   Oral Oral  SpO2: 94% 91% 93% 98%  Weight:    190 lb 4.8 oz (86.3 kg)  Height:       No intake or output data in the 24 hours ending 12/24/16 0800 Filed Weights   12/22/16 0459 12/23/16 0500 12/24/16 0321  Weight: 189 lb 14.4 oz (86.1 kg) 185 lb 6.4 oz (84.1 kg) 190 lb 4.8 oz (86.3 kg)    Physical Exam    GEN: Well nourished, well developed, in no acute distress.  HEENT: Grossly normal.  Neck: Supple, no JVD, carotid bruits, or masses. Cardiac: RRR, no murmurs, rubs, or gallops. No clubbing, cyanosis. 2+ edema.  Radials/DP/PT 2+ and equal bilaterally.  Respiratory:  Respirations regular and unlabored, clear to auscultation bilaterally. GI: Soft, nontender, nondistended, BS + x 4. MS: no deformity or atrophy. Skin: warm and dry, no rash. Neuro:  Strength and sensation are intact. Psych: AAOx3.  Normal affect.  Labs    CBC  Recent Labs  12/22/16 0502 12/23/16 0431 12/24/16 0222  WBC 9.0 6.5 6.4  NEUTROABS 5.7  --   --   HGB 13.5 12.8* 12.3*  HCT 42.2 39.5 38.5*  MCV 87.0 84.8 84.8  PLT 135* 113* 123456*   Basic Metabolic Panel  Recent Labs  12/23/16 0431 12/24/16 0222  NA 135 134*  K 3.9 3.6  CL 99* 98*  CO2 29 26  GLUCOSE 202* 241*  BUN 18 18  CREATININE 0.98 0.98  CALCIUM 8.7* 8.7*  MG 1.9  --    Liver Function Tests  Recent Labs  12/22/16 0502  AST 44*  ALT 35  ALKPHOS 84  BILITOT 1.1  PROT 6.6  ALBUMIN 3.1*   No results for input(s): LIPASE, AMYLASE in the last 72 hours. Cardiac Enzymes  Recent Labs  12/22/16 0502 12/22/16 1033 12/22/16 1613  TROPONINI <0.03 <0.03 0.06*   BNP Invalid input(s): POCBNP D-Dimer No results for input(s): DDIMER in the last 72 hours. Hemoglobin A1C No results for input(s): HGBA1C in the last 72 hours. Fasting Lipid Panel No results for input(s): CHOL, HDL, LDLCALC, TRIG, CHOLHDL, LDLDIRECT in the last 72 hours. Thyroid Function Tests No results for input(s): TSH, T4TOTAL, T3FREE, THYROIDAB in the last 72 hours.  Invalid input(s): FREET3  Telemetry    Sinus rhythm with ventricular ectopy - Personally Reviewed  ECG    Sinus rhythm, PRWP, long QTc - Personally  Reviewed  Radiology    No results found.  Cardiac Studies   - Left ventricle: The cavity size was normal. There was mild   concentric hypertrophy. Systolic function was severely reduced.   The estimated ejection fraction was in the range of 20% to 25%.   Diffuse hypokinesis. - Aortic valve: Valve mobility was restricted. There was mild   stenosis. There was mild regurgitation. Valve area (VTI): 1.02   cm^2. Valve area (Vmax): 0.97 cm^2. Valve area (Vmean): 0.95   cm^2. - Mitral valve: Calcified annulus. Transvalvular velocity was   within the normal range. There was no evidence for stenosis.   There was moderate regurgitation. - Left atrium: The atrium was moderately dilated. - Right ventricle: The cavity size was normal. Wall thickness was   normal. Systolic function was moderately reduced. - Tricuspid valve: There was mild-moderate regurgitation. - Pulmonary arteries: Systolic pressure was severely increased. PA   peak pressure: 68 mm Hg (S).  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 Admitted on 12/21/16 with acute pulmonary embolism. Cardiology consulted for management of his ischemic cardiomyopathy.    Assessment & Plan    1. NSVT: In the setting of known ischemic cardiomyopathy and patient has a AICD. He is on sotalol. QTc is prolonged this AM and K is low, Edward Meyer replete this AM.  2. Ischemic cardiomyopathy: Appears stable. Continue ACE-I, isosorbide, Coreg. Consider adding Spiro 12.5 mg daily  for mortality benefit. BP low but stable. Continue current medications.  3. History of CAD s/p CABG: no current chest pain  4. Acute PE: Per primary team, he is now on Xarelto.   Signed, Lisamarie Coke Meredith Leeds, MD  12/24/2016, 8:00 AM

## 2016-12-24 NOTE — Progress Notes (Signed)
Patient had 8 beat run of vtach. Vitals stable, patient asymptomatic. [facility] paged with info. No orders at this time. Will continue to monitor.

## 2016-12-24 NOTE — Discharge Summary (Signed)
Physician Discharge Summary  Edward Meyer [id number] DOB: 15-May-1941  PCP: Pcp Not In System  Admit date: 12/21/2016 Discharge date: 12/24/2016  Recommendations for Outpatient Follow-up:  1. PCP in [place] in one week with repeat labs (CBC, BMP & magnesium). Will need repeat chest x-ray in 3-4 weeks. 2. Primary Cardiologist in [place], in 1 week.  Home Health: None Equipment/Devices: Nebulizer machine    Discharge Condition: Improved and stable  CODE STATUS: Full  Diet recommendation: Heart healthy and diabetic diet.  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)   Cirrhosis of liver (HCC)   Chronic CHF (congestive heart failure) (HCC)   CAP (community acquired pneumonia)   Pulmonary embolus ([place])   Chronic combined systolic and diastolic congestive heart failure ([place])   Pulmonary embolism ([place])   Brief/Interim Summary: 75 year old male, resident of [place], visiting daughter in [place], [place], with PMH of chronic respiratory failure on home oxygen 2 L/m at bedtime, COPD, chronic atrial fibrillation on Coumadin anticoagulation prior to admission, chronic systolic CHF, CAD, DM 2/IDDM, HTN, recent hospitalization 12/15/16-12/19/16 for complaints of dyspnea, productive cough and upper back/left shoulder pain at which time he had a CTA chest suspicious for acute PE but lower extremity Dopplers were negative for DVT, case was discussed with radiologist and findings were felt to be related to artifact rather than acute PE and he was treated for possible left lower lobe pneumonia, now presented back in 48 hours later with worsening dyspnea without productive cough, fever or chills. Repeat CTA chest shows PE in the  right side. Admitted for further evaluation and management.   Assessment & Plan:   1. Acute right lower lobe PE: CT chest 12/28 showed right lower lobe segmental and subsegmental PE. This seems to have occurred despite therapeutic or even supratherapeutic Coumadin levels since 12/15/16. Case was discussed by EDP with on call hematologist who advised switching over to full dose Lovenox. Hematology consultation 12/28 appreciated and patient has been switched to Xarelto. Case management as provided patient with discount coupons. 2. Chronic systolic CHF/ischemic cardiomyopathy/s/p AICD per patients report: 2-D echo 12/16/16 shows LVEF 20-25 percent and diffuse hypokinesis. Remains mildly volume overloaded as suggested by trace ankle edema but no JVD. Continue home dose of Lasix 160 mg daily. Continue nitrates. Cardiology input appreciated and started low-dose Aldactone 12.5 MG daily. Consider ACEI as outpatient. 3. COPD/chronic respiratory failure on home oxygen 2 L/m at bedtime: No clinical exacerbation. 4. Paroxysmal atrial fibrillation: Continue carvedilol (dose increased) and sotalol. Anticoagulation discussion as above. AC switched to Xarelto this admission 5. Lingualar PNA: Completed levofloxacin 12/30. Recommend repeating chest x-ray in 3-4 weeks. 6. Type II DM/IDDM:  fluctuating and mildly uncontrolled in the hospital. Continue home dose of Lantus but will need follow-up with his PCP regarding further management. 7. Left lower extremity venous stasis ulcers: Wound care consultation appreciated and management per them. 8. Essential hypertension: Controlled. 9. Thrombocytopenia: Unclear etiology. Stable. Follow CBC periodically.   10. CAD status post remote CABG: Stable.  11. NSVT: Cardiology input appreciated. Continue sotalol. Carvedilol increased to 6.25 MG twice a day. Monitor on telemetry. Had 8 beat NSVT. Discussed with rounding EP cardiologist this morning > QTC prolonging this a.m. and  potassium was low and hence replaced K and  cleared for discharge.    Consultants:   Hematology  Cardiology  Procedures:   None   Discharge Instructions  Discharge Instructions    (HEART FAILURE PATIENTS) Call MD:  Anytime you have any of the following symptoms: 1) 3 pound weight gain in 24 hours or 5 pounds in 1 week 2) shortness of breath, with or without a dry hacking cough 3) swelling in the hands, feet or stomach 4) if you have to sleep on extra pillows at night in order to breathe.    Complete by:  As directed    Call MD for:  difficulty breathing, headache or visual disturbances    Complete by:  As directed    Call MD for:  extreme fatigue    Complete by:  As directed    Call MD for:  persistant dizziness or light-headedness    Complete by:  As directed    Call MD for:  redness, tenderness, or signs of infection (pain, swelling, redness, odor or green/yellow discharge around incision site)    Complete by:  As directed    Call MD for:  severe uncontrolled pain    Complete by:  As directed    Call MD for:  temperature >100.4    Complete by:  As directed    Diet - low sodium heart healthy    Complete by:  As directed    Diet Carb Modified    Complete by:  As directed    Discharge instructions    Complete by:  As directed    Please get a complete blood count and chemistry panel checked by your Primary MD at your next visit, and again as instructed by your Primary MD. Please get your medications reviewed and adjusted by your Primary MD.  Please request your Primary MD to go over all Hospital Tests and Procedure/Radiological results at the follow up, please get all Hospital records sent to your Prim MD by signing hospital release before you go home.  If you had Pneumonia of Lung problems at the Hospital: Please get a 2 view Chest X ray done in 3-4 weeks after hospital discharge or sooner if instructed by your Primary MD.  If you have Congestive Heart Failure: Please  call your Cardiologist or Primary MD anytime you have any of the following symptoms:  1) 3 pound weight gain in 24 hours or 5 pounds in 1 week  2) shortness of breath, with or without a dry hacking cough  3) swelling in the hands, feet or stomach  4) if you have to sleep on extra pillows at night in order to breathe  Follow cardiac low salt diet and 1.5 lit/day fluid restriction.  If you have diabetes Accuchecks 4 times/day, Once in AM empty stomach and then before each meal. Log in all results and show them to your primary doctor at your next visit. If any glucose reading is under 80 or above 300 call your primary MD immediately.  If you have Seizure/Convulsions/Epilepsy: Please do not drive, operate heavy machinery, participate in activities at heights or participate in high speed sports until you have seen by Primary MD or a Neurologist and advised to do so again.  If you had Gastrointestinal Bleeding: Please ask your Primary MD to check a complete blood count within one week of discharge or at your next visit. Your endoscopic/colonoscopic biopsies that are pending at the time of discharge, will also need to followed by your Primary MD.  Get Medicines reviewed and  adjusted. Please take all your medications with you for your next visit with your Primary MD  Please request your Primary MD to go over all hospital tests and procedure/radiological results at the follow up, please ask your Primary MD to get all Hospital records sent to his/her office.  If you experience worsening of your admission symptoms, develop shortness of breath, life threatening emergency, suicidal or homicidal thoughts you must seek medical attention immediately by calling 911 or calling your MD immediately  if symptoms less severe.  You must read complete instructions/literature along with all the possible adverse reactions/side effects for all the Medicines you take and that have been prescribed to you. Take any new  Medicines after you have completely understood and accpet all the possible adverse reactions/side effects.   Do not drive or operate heavy machinery when taking Pain medications.   Do not take more than prescribed Pain, Sleep and Anxiety Medications  Special Instructions: If you have smoked or chewed Tobacco  in the last 2 yrs please stop smoking, stop any regular Alcohol  and or any Recreational drug use.  Wear Seat belts while driving.  Please note You were cared for by a hospitalist during your hospital stay. If you have any questions about your discharge medications or the care you received while you were in the hospital after you are discharged, you can call the unit and asked to speak with the hospitalist on call if the hospitalist that took care of you is not available. Once you are discharged, your primary care physician will handle any further medical issues. Please note that NO REFILLS for any discharge medications will be authorized once you are discharged, as it is imperative that you return to your primary care physician (or establish a relationship with a primary care physician if you do not have one) for your aftercare needs so that they can reassess your need for medications and monitor your lab values.  You can reach the hospitalist office at phone [phone] or fax [phone]   If you do not have a primary care physician, you can call [phone] for a physician referral.  Activity: As tolerated with Full fall precautions use walker/cane & assistance as needed   Increase activity slowly    Complete by:  As directed        Medication List    STOP taking these medications   levofloxacin 500 MG tablet Commonly known as:  LEVAQUIN   warfarin 5 MG tablet Commonly known as:  COUMADIN     TAKE these medications   carvedilol 3.125 MG tablet Commonly known as:  COREG Take 2 tablets (6.25 mg total) by mouth 2 (two) times daily with a meal. What changed:  how much to  take   collagenase ointment Commonly known as:  SANTYL Apply topically daily. Apply to left lower leg wound daily. Apply Santyl to the remaining open wound, 1/4" thick layer and moist 2x2 gauze. Cover with dry dressing and kerlix. Change daily. Start taking on:  12/25/2016   furosemide 40 MG tablet Commonly known as:  LASIX Take 160 mg by mouth daily.   HYDROcodone-acetaminophen 5-325 MG tablet Commonly known as:  NORCO/VICODIN Take 1-2 tablets by mouth every 4 (four) hours as needed for moderate pain or severe pain.   insulin glargine 100 UNIT/ML injection Commonly known as:  LANTUS Inject 0.15 mLs (15 Units total) into the skin at bedtime.   ipratropium-albuterol 0.5-2.5 (3) MG/3ML Soln Commonly known as:  DUONEB Take 3 mLs  by nebulization every 4 (four) hours as needed.   isosorbide mononitrate 10 MG tablet Commonly known as:  ISMO,MONOKET Take 1 tablet (10 mg total) by mouth daily at 8 pm.   OXYGEN Inhale 2 L into the lungs at bedtime as needed (for SOB).   potassium chloride SA 20 MEQ tablet Commonly known as:  K-DUR,KLOR-CON Take 20 mEq by mouth daily.   Rivaroxaban 15 & 20 MG Tbpk Take as directed on package: Start with one 15mg  tablet by mouth twice a day with food. On Day 22, switch to one 20mg  tablet once a day with food. Please discard first 3 tablets of the pack which he has received in the hospital.   ROLAIDS 550-110 MG Chew Generic drug:  Ca Carbonate-Mag Hydroxide Chew 1 each by mouth daily as needed (heartburn).   simvastatin 40 MG tablet Commonly known as:  ZOCOR Take 40 mg by mouth daily at 6 PM.   sotalol 80 MG tablet Commonly known as:  BETAPACE Take 80 mg by mouth 2 (two) times daily.   spironolactone 25 MG tablet Commonly known as:  ALDACTONE Take 0.5 tablets (12.5 mg total) by mouth daily. Start taking on:  12/25/2016      Follow-up Information    Family Doctor in [place]. Schedule an appointment as soon as possible for a visit in 1  week(s).   Why:  To be seen with repeat labs (CBC, BMP & magnesium). Will need repeat chest x-ray in 3-4 weeks.       Cardiologist in [place]. Schedule an appointment as soon as possible for a visit in 1 week(s).          No Known Allergies   Procedures/Studies: Dg Chest 2 View  Result Date: 12/21/2016 CLINICAL DATA:  Shortness of breath and cough for 1 week. Recent hospitalization and discharge without reported improvement symptoms. EXAM: CHEST  2 VIEW COMPARISON:  Radiographs and CT 12/15/2016 FINDINGS: Patient is post median sternotomy. Dual lead left-sided pacemaker remains in place. The heart size and mediastinal contours are unchanged with mild cardiomegaly. Coronary stent versus calcifications. No pulmonary edema. Small right pleural effusion appears similar to prior exams. Lingular opacity on chest CT not well visualized radiographically. No pneumothorax. Stable degenerative change in the spine. IMPRESSION: Cardiomegaly and small right pleural effusion, unchanged from prior exams. No new or acute abnormality. Lingular opacity on prior chest CT not well visualized radiographically. Electronically Signed   By: Jeb Levering M.D.   On: 12/21/2016 21:15   Dg Chest 2 View  Result Date: 12/15/2016 CLINICAL DATA:  Productive cough with shortness of breath for 3 days. Pain in the neck, both shoulders and upper back. History of asthma, pacemaker and diabetes. EXAM: CHEST  2 VIEW COMPARISON:  None. FINDINGS: Left subclavian AICD leads are present within the right atrium and right ventricle. The heart size is normal status post median sternotomy. There is aortic atherosclerosis. The lungs appear clear. There is no pleural effusion or pneumothorax. EKG snap overlies the upper right chest. Mild thoracic spine degenerative changes are noted. IMPRESSION: No acute cardiopulmonary process or explanation for the patient's symptoms identified. Electronically Signed   By: Richardean Sale M.D.   On:  12/15/2016 18:45   Ct Angio Chest Pe W Or Wo Contrast  Result Date: 12/22/2016 CLINICAL DATA:  Dyspnea x1 week. Possible pulmonary embolus versus artifact 1 week ago on prior CT. EXAM: CT ANGIOGRAPHY CHEST WITH CONTRAST TECHNIQUE: Multidetector CT imaging of the chest was performed using  the standard protocol during bolus administration of intravenous contrast. Multiplanar CT image reconstructions and MIPs were obtained to evaluate the vascular anatomy. CONTRAST:  80 cc of Isovue 370 IV COMPARISON:  12/15/2016 CT FINDINGS: Cardiovascular: Stable cardiomegaly with dilatation the right atrium and retrograde flow of contrast is again noted from the right atrium into the IVC compatible with right heart failure. Coronary arteriosclerosis is again noted of the left main, lad, circumflex and RCA. No pericardial effusion. AICD device is again noted with leads projecting into the right atrium and right ventricle. Moderate atherosclerosis of the thoracic aorta. No aneurysmal dilatation. Once again there are filling defects to the right lower lobe segmental and subsegmental pulmonary arteries in keeping with pulmonary emboli, in similar distribution as noted on prior study, series 5 image 166 through 200. Unfortunately the RV/LV ratio cannot be adequately determined due to lack of contrast within the left ventricle due to timing of the contrast bolus. Mediastinum/Nodes: No mediastinal nor hilar lymphadenopathy. The esophagus is grossly unremarkable. No mediastinal fluid collection. No thyroid nodules noted. Lungs/Pleura: There is mild upper lobe predominant centrilobular emphysema with stable small right pleural effusion and associated mild compressive atelectasis in the right lower lobe. Minimal left lingular airspace opacity may reflect atelectasis or minimal pneumonia. This is without significant change. Upper Abdomen: Cirrhotic appearing liver with small volume ascites noted in the right upper quadrant. 19 mm  low-density nodule of the left adrenal gland may represent a small adenoma. Musculoskeletal: No acute osseous abnormality. Median sternotomy sutures are noted. Small osteophytes along the lower thoracic spine. Review of the MIP images confirms the above findings. IMPRESSION: Similar filling defect noted within segmental and subsegmental pulmonary arteries of the right lower lobe consistent with pulmonary embolus. Unfortunately due to timing of contrast, the RV/ LV ratio cannot be adequately assessed. No significant convex bowing of the septum towards the left ventricle is seen however. Critical Value/emergent results were called by telephone at the time of interpretation on 12/22/2016 at 1:37 am to Dr. Joseph Berkshire , who verbally acknowledged these results. Minimal lingular airspace opacity may reflect small focus of pneumonia not significantly changed. Stable small right pleural effusion with compressive atelectasis and centrilobular emphysema. Moderate cardiomegaly with coronary arteriosclerosis. Right cardiac dysfunction noted with reflux of contrast into the IVC and hepatic veins. Aortic atherosclerosis. Electronically Signed   By: Ashley Royalty M.D.   On: 12/22/2016 01:38   Ct Angio Chest Pe W Or Wo Contrast  Result Date: 12/15/2016 CLINICAL DATA:  75 year old male with shortness of breath for several years. History of COPD, coronary artery disease, and CHF. EXAM: CT ANGIOGRAPHY CHEST WITH CONTRAST TECHNIQUE: Multidetector CT imaging of the chest was performed using the standard protocol during bolus administration of intravenous contrast. Multiplanar CT image reconstructions and MIPs were obtained to evaluate the vascular anatomy. CONTRAST:  80 cc Isovue 370 COMPARISON:  Chest radiograph dated 12/15/2016 FINDINGS: Cardiovascular: There is moderate cardiomegaly with dilatation of the right atrium. There is retrograde flow of contrast from the right atrium into the IVC compatible with a degree of right  cardiac dysfunction. Correlation with echocardiogram recommended. There is advanced coronary vascular calcification involving the left main, LAD, left circumflex artery, and to lesser degree RCA. There is no pericardial effusion. Left pectoral dual lead AICD device with leads in the right atrium and right ventricle. There is moderate atherosclerotic calcification of the thoracic aorta. There is no aneurysmal dilatation. Evaluation of the pulmonary arteries is limited due to suboptimal opacification of the  peripheral branches. There is however decreased opacification of the segmental branch point and subsegmental branches of the right lower lobe pulmonary artery (series 5 image 164- 195) most suspicious for pulmonary artery embolus. Mediastinum/Nodes: There is no hilar or mediastinal adenopathy. The esophagus is grossly unremarkable. No mediastinal fluid collection. No thyroid nodules identified. Lungs/Pleura: Mild centrilobular emphysema. There is diffuse chronic interstitial coarsening and hazy ground-glass density throughout the lungs. No interstitial edema. There is a focal area of consolidation in the left infrahilar region within the lingula concerning for pneumonia. A focal pulmonary infarct is less likely. There is a small right pleural effusion with associated mild compressive atelectasis of the right lower lobe. Pneumonia is less likely. There is no pneumothorax. The central airways are patent. Upper Abdomen: There is irregularity of the hepatic contour compatible with cirrhosis. Small perihepatic free fluid noted. The visualized upper abdomen is otherwise unremarkable. Musculoskeletal: Degenerative changes of the spine with multilevel osteophyte. No acute fracture. Median sternotomy wires noted and appear intact. Review of the MIP images confirms the above findings. IMPRESSION: Non opacification of the segmental branch point and subsegmental branches of the right lower lobe. Although there is suboptimal  opacification of the distal pulmonary artery branches, findings are suspicious for PE. Focal left infrahilar/lingular consolidative change concerning for pneumonia. The pulmonary infarct is less likely. Clinical correlation is recommended. Moderate cardiomegaly with multivessel coronary vascular disease and evidence of right cardiac dysfunction. Correlation with echocardiogram recommended. Small right pleural effusion. Cirrhosis with partially visualized small perihepatic ascites. These results were called by telephone at the time of interpretation on 12/15/2016 at 10:13 pm to Dr. Veryl Speak , who verbally acknowledged these results. Electronically Signed   By: Anner Crete M.D.   On: 12/15/2016 22:21   Ct Cervical Spine Wo Contrast  Result Date: 12/15/2016 CLINICAL DATA:  75 year old male with neck pain. EXAM: CT CERVICAL SPINE WITHOUT CONTRAST TECHNIQUE: Multidetector CT imaging of the cervical spine was performed without intravenous contrast. Multiplanar CT image reconstructions were also generated. COMPARISON:  None. FINDINGS: Alignment: Normal. Skull base and vertebrae: No acute fracture. No primary bone lesion or focal pathologic process. Soft tissues and spinal canal: No prevertebral fluid or swelling. No visible canal hematoma. Disc levels: There is degenerative changes. Disc osteophyte complex with moderate narrowing of the left neural foramina at C5-C6. The remainder of the neural foramina as well as the central canal appear widely patent. Upper chest: Negative. Cardiac pacemaker wires partially visualized in the left chest wall. Other: None IMPRESSION: No acute/ traumatic cervical spine pathology. Degenerative changes with disc osteophyte complex and moderate narrowing of the left neural foramina at C5-C6. Electronically Signed   By: Anner Crete M.D.   On: 12/15/2016 21:58      Subjective: Denies complaints. Breathing at baseline. Denies dyspnea, chest pain or  palpitations.  Discharge Exam:  Vitals:   12/23/16 1718 12/23/16 1720 12/23/16 2011 12/24/16 0321  BP:   (!) 94/50 (!) 99/40  Pulse:   71 71  Resp:   20 20  Temp:   97.8 F (36.6 C) 97.9 F (36.6 C)  TempSrc:   Oral Oral  SpO2: 94% 91% 93% 98%  Weight:    86.3 kg (190 lb 4.8 oz)  Height:        General exam: Pleasant elderly male sitting up comfortably at edge of bed this morning, eating breakfast. Respiratory system: clear to auscultation. Respiratory effort normal. Cardiovascular system: S1 & S2 heard, RRR. No JVD, murmurs, rubs, gallops or  clicks. Trace pedal edema. Telemetry: Sinus rhythm/on demand atrial pacing. 8 bt NSVT.  Gastrointestinal system: Abdomen is nondistended, soft and nontender. No organomegaly or masses felt. Normal bowel sounds heard. Central nervous system: Alert and oriented. No focal neurological deficits. Extremities: Symmetric 5 x 5 power. Left leg dressing clean and dry. Skin: No rashes, lesions or ulcers Psychiatry: Judgement and insight appear normal. Mood & affect appropriate.     The results of significant diagnostics from this hospitalization (including imaging, microbiology, ancillary and laboratory) are listed below for reference.     Microbiology: No results found for this or any previous visit (from the past 240 hour(s)).   Labs: BNP (last 3 results)  Recent Labs  12/15/16 1816 12/21/16 1947  BNP 453.6* [id number]*   Basic Metabolic Panel:  Recent Labs Lab 12/18/16 0341 12/19/16 0424 12/21/16 1947 12/22/16 0502 12/23/16 0431 12/24/16 0222  NA 133* 133* 134* 133* 135 134*  K 4.1 4.5 4.5 3.7 3.9 3.6  CL 98* 98* 99* 97* 99* 98*  CO2 27 31 24 28 29 26   GLUCOSE 184* 160* 223* 157* 202* 241*  BUN 25* 24* 28* 25* 18 18  CREATININE 1.33* 1.23 1.24 1.08 0.98 0.98  CALCIUM 8.6* 8.8* 9.4 8.9 8.7* 8.7*  MG 2.1  --   --   --  1.9  --    Liver Function Tests:  Recent Labs Lab 12/22/16 0502  AST 44*  ALT 35  ALKPHOS 84  BILITOT  1.1  PROT 6.6  ALBUMIN 3.1*   No results for input(s): LIPASE, AMYLASE in the last 168 hours. No results for input(s): AMMONIA in the last 168 hours. CBC:  Recent Labs Lab 12/19/16 0424 12/21/16 1947 12/22/16 0502 12/23/16 0431 12/24/16 0222  WBC 6.5 8.4 9.0 6.5 6.4  NEUTROABS  --   --  5.7  --   --   HGB 13.0 14.0 13.5 12.8* 12.3*  HCT 39.7 42.9 42.2 39.5 38.5*  MCV 85.6 85.0 87.0 84.8 84.8  PLT 123* 152 135* 113* 105*   Cardiac Enzymes:  Recent Labs Lab 12/22/16 0502 12/22/16 1033 12/22/16 1613  TROPONINI <0.03 <0.03 0.06*   BNP: Invalid input(s): POCBNP CBG:  Recent Labs Lab 12/18/16 2141 12/19/16 0820 12/22/16 2236 12/23/16 2132 12/24/16 1118  GLUCAP 190* 167* 278* 304* 239*   Discussed in detail with patient's daughter via phone. Updated care and answered questions. Advised both patient and daughter that when he reaches his home in [place], he should have his PCP and/or cardiologist obtain all medical records from the [facility] for follow-up and management. They verbalized understanding. Patient stated that he had actually spoken to his Cardiologist yesterday.  Time coordinating discharge: Over 30 minutes  SIGNED:  Vernell Leep, MD, FACP, [place]. [facility] Pager [phone]  If 7PM-7AM, please contact night-coverage [email or web address] Password [facility] 12/24/2016, 12:38 PM

## 2016-12-24 NOTE — Progress Notes (Signed)
Discharged to home with family office visits in place teaching done  

## 2017-06-25 DEATH — deceased

## 2018-12-09 IMAGING — CT CT ANGIO CHEST
2 of 8 series · 17 of 46 positions shown · IV contrast (OMNI)
Comparison: 12/15/2016 CT

CLINICAL DATA: Dyspnea x1 week. Possible pulmonary embolus versus
artifact 1 week ago on prior CT.

EXAM:
CT ANGIOGRAPHY CHEST WITH CONTRAST
TECHNIQUE: Multidetector CT imaging of the chest was performed using the
standard protocol during bolus administration of intravenous
contrast. Multiplanar CT image reconstructions and MIPs were
obtained to evaluate the vascular anatomy.
CONTRAST:  80 cc of Isovue 370 IV

[Series 5: thins · axial · 0.66mm/px · z∈[+1095,+1387]mm · 14 of 322 slices shown]
[im 15/322  lung]
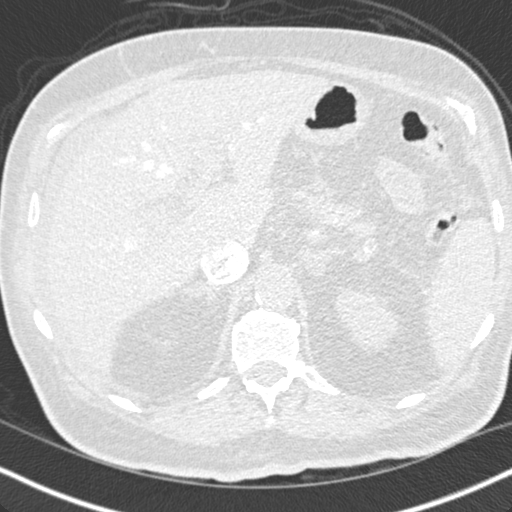
[im 44/322  soft-tissue]
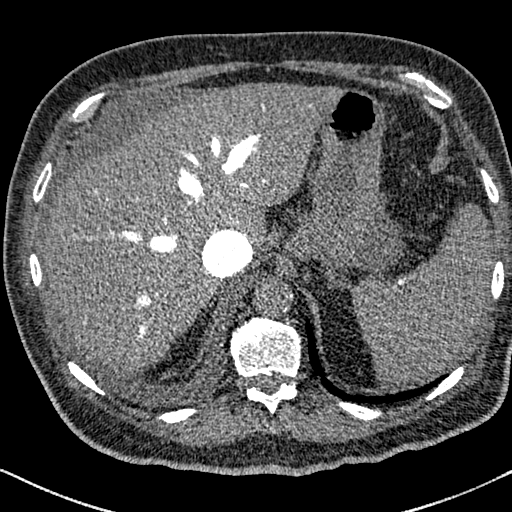
[im 59/322  lung]
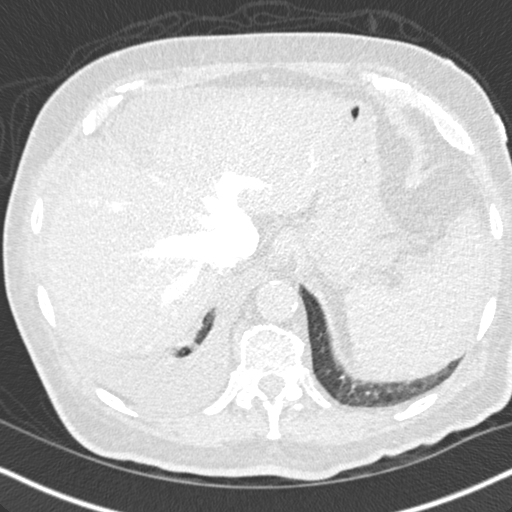
[im 88/322  soft-tissue]
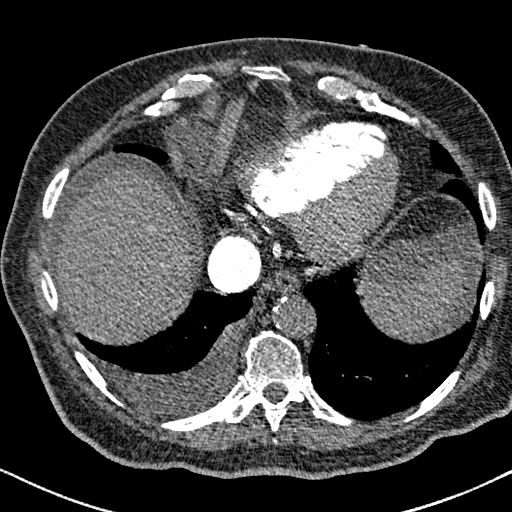
[im 103/322  lung]
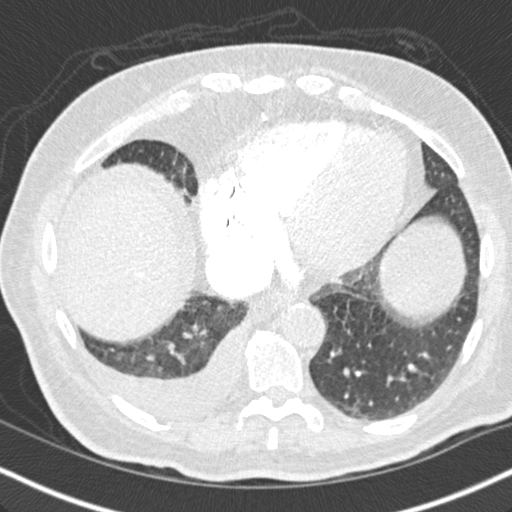
[im 132/322  soft-tissue]
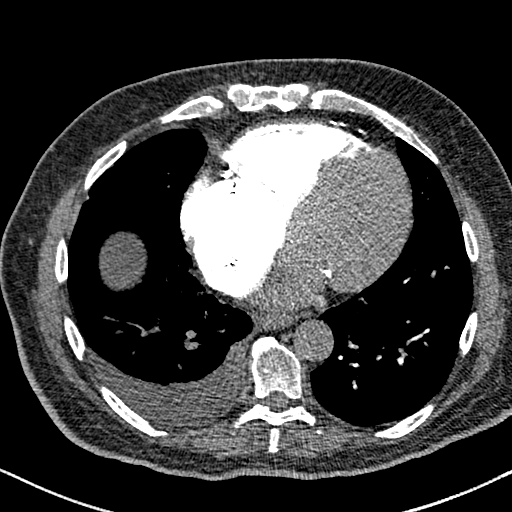
[im 146/322  lung]
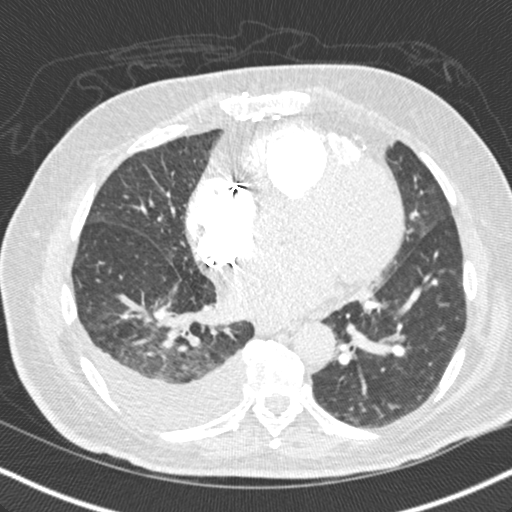
[im 176/322  soft-tissue]
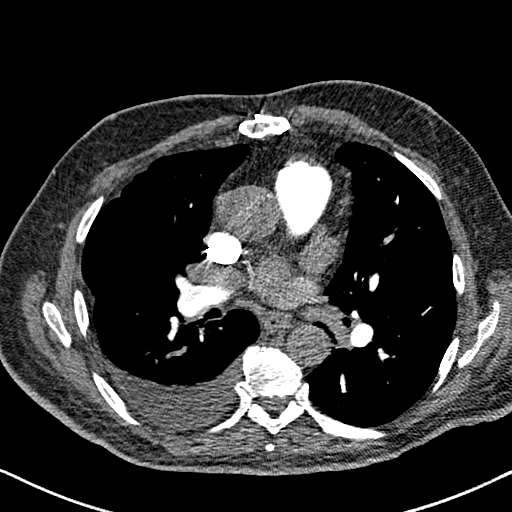
[im 190/322  lung]
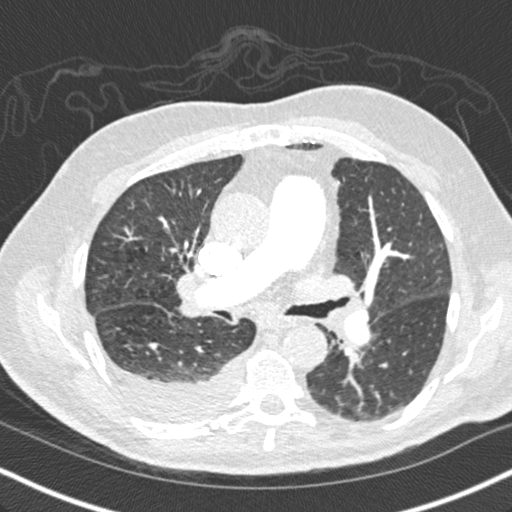
[im 219/322  soft-tissue]
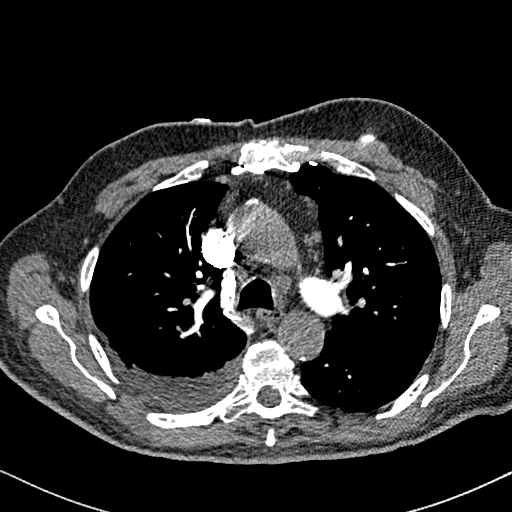
[im 234/322  lung]
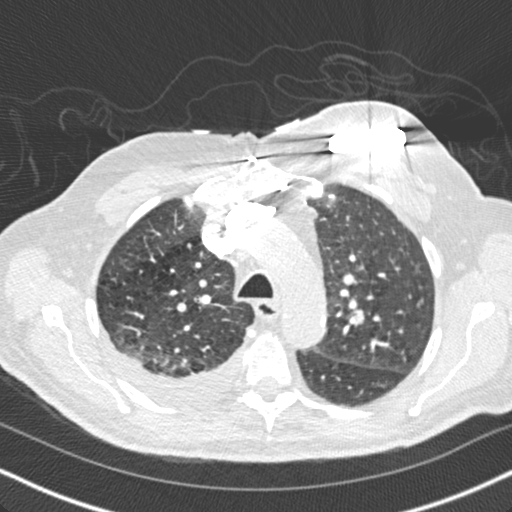
[im 263/322  soft-tissue]
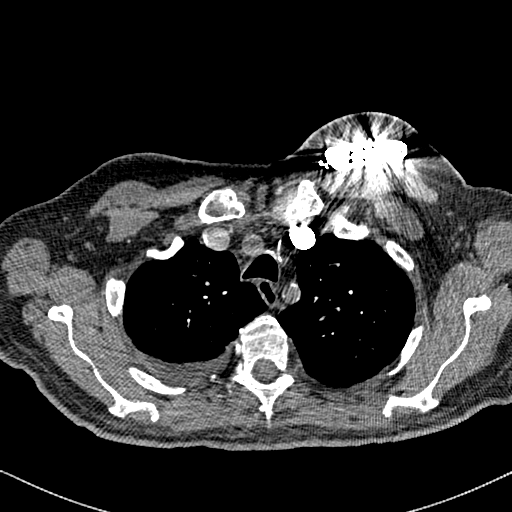
[im 278/322  lung]
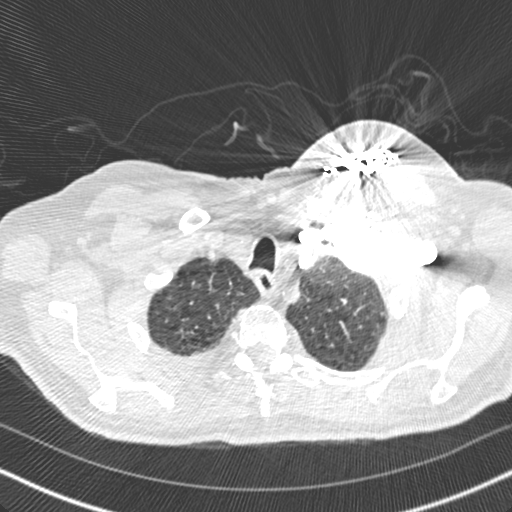
[im 307/322  soft-tissue]
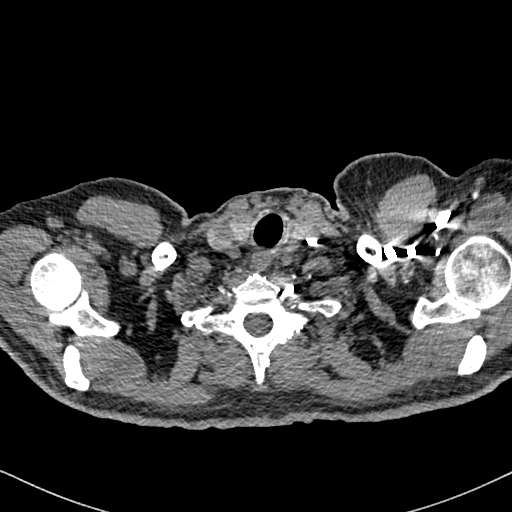

[Series 7: coronal mpr · coronal · 0.63mm/px · 3 of 131 slices shown]
[im 33/131  soft-tissue]
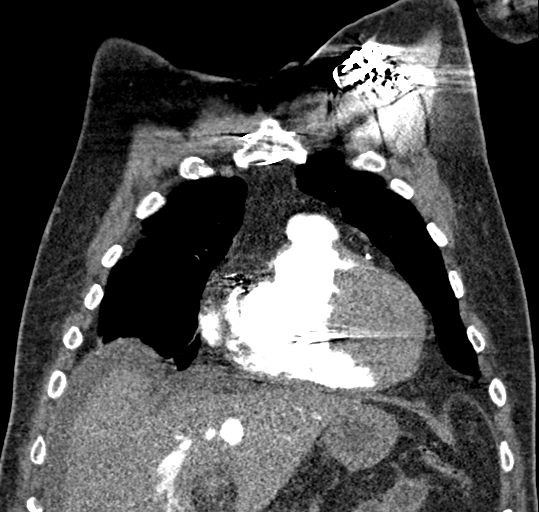
[im 66/131  soft-tissue]
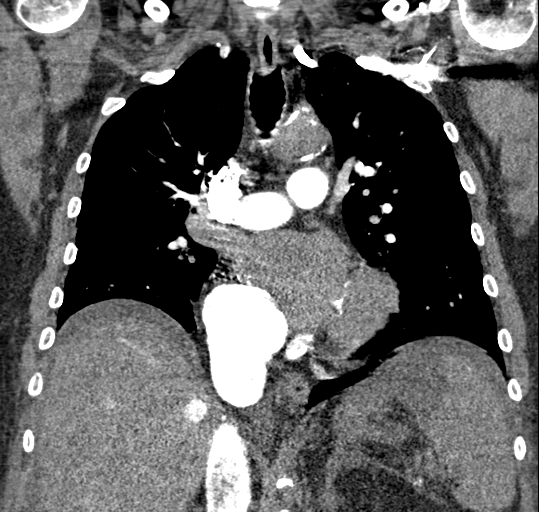
[im 98/131  soft-tissue]
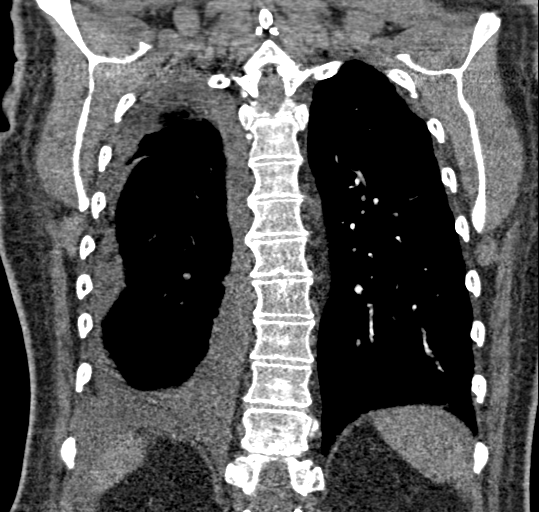

[17 of 46 positions shown; findings below may reference images not displayed]

FINDINGS: Cardiovascular: Stable cardiomegaly with dilatation the right atrium
and retrograde flow of contrast is again noted from the right atrium
into the IVC compatible with right heart failure. Coronary
arteriosclerosis is again noted of the left main, lad, circumflex
and RCA. No pericardial effusion. AICD device is again noted with
leads projecting into the right atrium and right ventricle. Moderate
atherosclerosis of the thoracic aorta. No aneurysmal dilatation.

Once again there are filling defects to the right lower lobe
segmental and subsegmental pulmonary arteries in keeping with
pulmonary emboli, in similar distribution as noted on prior study,
series 5 image 166 through 200. Unfortunately the RV/LV ratio cannot
be adequately determined due to lack of contrast within the left
ventricle due to timing of the contrast bolus.

Mediastinum/Nodes: No mediastinal nor hilar lymphadenopathy. The
esophagus is grossly unremarkable. No mediastinal fluid collection.
No thyroid nodules noted.

Lungs/Pleura: There is mild upper lobe predominant centrilobular
emphysema with stable small right pleural effusion and associated
mild compressive atelectasis in the right lower lobe. Minimal left
lingular airspace opacity may reflect atelectasis or minimal
pneumonia. This is without significant change.

Upper Abdomen: Cirrhotic appearing liver with small volume ascites
noted in the right upper quadrant. 19 mm low-density nodule of the
left adrenal gland may represent a small adenoma.

Musculoskeletal: No acute osseous abnormality. Median sternotomy
sutures are noted. Small osteophytes along the lower thoracic spine.

Review of the MIP images confirms the above findings.
IMPRESSION: Similar filling defect noted within segmental and subsegmental
pulmonary arteries of the right lower lobe consistent with pulmonary
embolus. Unfortunately due to timing of contrast, the RV/ LV ratio
cannot be adequately assessed. No significant convex bowing of the
septum towards the left ventricle is seen however. Critical
Value/emergent results were called by telephone at the time of
interpretation on 12/22/2016 at [DATE] to Dr. KASHEX SPANNEBERG ,
who verbally acknowledged these results.

Minimal lingular airspace opacity may reflect small focus of
pneumonia not significantly changed. Stable small right pleural
effusion with compressive atelectasis and centrilobular emphysema.

Moderate cardiomegaly with coronary arteriosclerosis. Right cardiac
dysfunction noted with reflux of contrast into the IVC and hepatic
veins.

Aortic atherosclerosis.
